# Patient Record
Sex: Female | Born: 1940 | Race: Black or African American | Hispanic: No | Smoking: Current some day smoker
Health system: Southern US, Community
[De-identification: ages and names within clinical notes are randomized; demographics above are authoritative.]

## PROBLEM LIST (undated history)

## (undated) DIAGNOSIS — H269 Unspecified cataract: Secondary | ICD-10-CM

## (undated) DIAGNOSIS — F1027 Alcohol dependence with alcohol-induced persisting dementia: Secondary | ICD-10-CM

## (undated) DIAGNOSIS — F0390 Unspecified dementia without behavioral disturbance: Secondary | ICD-10-CM

## (undated) DIAGNOSIS — Z86718 Personal history of other venous thrombosis and embolism: Secondary | ICD-10-CM

## (undated) DIAGNOSIS — R413 Other amnesia: Secondary | ICD-10-CM

## (undated) DIAGNOSIS — Z9981 Dependence on supplemental oxygen: Secondary | ICD-10-CM

## (undated) DIAGNOSIS — J449 Chronic obstructive pulmonary disease, unspecified: Secondary | ICD-10-CM

## (undated) DIAGNOSIS — M8580 Other specified disorders of bone density and structure, unspecified site: Secondary | ICD-10-CM

## (undated) DIAGNOSIS — F1011 Alcohol abuse, in remission: Secondary | ICD-10-CM

## (undated) DIAGNOSIS — M1990 Unspecified osteoarthritis, unspecified site: Secondary | ICD-10-CM

## (undated) DIAGNOSIS — K219 Gastro-esophageal reflux disease without esophagitis: Secondary | ICD-10-CM

## (undated) HISTORY — PX: HIP SURGERY: SHX245

## (undated) HISTORY — DX: Other amnesia: R41.3

## (undated) HISTORY — DX: Alcohol abuse, in remission: F10.11

## (undated) HISTORY — DX: Alcohol dependence with alcohol-induced persisting dementia: F10.27

## (undated) HISTORY — PX: FRACTURE SURGERY: SHX138

---

## 1999-06-19 ENCOUNTER — Encounter: Payer: Self-pay | Admitting: Emergency Medicine

## 1999-06-19 ENCOUNTER — Inpatient Hospital Stay (HOSPITAL_COMMUNITY): Admission: EM | Admit: 1999-06-19 | Discharge: 1999-06-22 | Payer: Self-pay | Admitting: Emergency Medicine

## 2001-02-27 ENCOUNTER — Ambulatory Visit (HOSPITAL_COMMUNITY): Admission: RE | Admit: 2001-02-27 | Discharge: 2001-02-27 | Payer: Self-pay | Admitting: Family Medicine

## 2001-02-27 ENCOUNTER — Encounter: Payer: Self-pay | Admitting: Family Medicine

## 2002-01-29 ENCOUNTER — Inpatient Hospital Stay (HOSPITAL_COMMUNITY): Admission: EM | Admit: 2002-01-29 | Discharge: 2002-02-06 | Payer: Self-pay | Admitting: Emergency Medicine

## 2002-01-29 ENCOUNTER — Encounter: Payer: Self-pay | Admitting: Emergency Medicine

## 2002-02-05 ENCOUNTER — Encounter: Payer: Self-pay | Admitting: Family Medicine

## 2004-07-12 ENCOUNTER — Ambulatory Visit (HOSPITAL_COMMUNITY): Admission: RE | Admit: 2004-07-12 | Discharge: 2004-07-12 | Payer: Self-pay | Admitting: Anesthesiology

## 2005-07-06 ENCOUNTER — Other Ambulatory Visit: Admission: RE | Admit: 2005-07-06 | Discharge: 2005-07-06 | Payer: Self-pay | Admitting: Obstetrics and Gynecology

## 2005-07-31 ENCOUNTER — Emergency Department (HOSPITAL_COMMUNITY): Admission: EM | Admit: 2005-07-31 | Discharge: 2005-08-01 | Payer: Self-pay | Admitting: Emergency Medicine

## 2006-07-14 ENCOUNTER — Inpatient Hospital Stay (HOSPITAL_COMMUNITY): Admission: EM | Admit: 2006-07-14 | Discharge: 2006-07-22 | Payer: Self-pay | Admitting: Emergency Medicine

## 2006-07-18 ENCOUNTER — Encounter (INDEPENDENT_AMBULATORY_CARE_PROVIDER_SITE_OTHER): Payer: Self-pay | Admitting: Specialist

## 2006-07-19 ENCOUNTER — Encounter (INDEPENDENT_AMBULATORY_CARE_PROVIDER_SITE_OTHER): Payer: Self-pay | Admitting: *Deleted

## 2006-09-08 ENCOUNTER — Inpatient Hospital Stay (HOSPITAL_COMMUNITY): Admission: EM | Admit: 2006-09-08 | Discharge: 2006-09-14 | Payer: Self-pay | Admitting: Emergency Medicine

## 2007-06-22 ENCOUNTER — Emergency Department (HOSPITAL_COMMUNITY): Admission: EM | Admit: 2007-06-22 | Discharge: 2007-06-22 | Payer: Self-pay | Admitting: Emergency Medicine

## 2007-08-18 ENCOUNTER — Inpatient Hospital Stay (HOSPITAL_COMMUNITY): Admission: EM | Admit: 2007-08-18 | Discharge: 2007-08-20 | Payer: Self-pay | Admitting: Emergency Medicine

## 2008-04-24 ENCOUNTER — Inpatient Hospital Stay (HOSPITAL_COMMUNITY): Admission: EM | Admit: 2008-04-24 | Discharge: 2008-05-04 | Payer: Self-pay | Admitting: Emergency Medicine

## 2008-04-30 ENCOUNTER — Ambulatory Visit: Payer: Self-pay | Admitting: Surgery

## 2008-04-30 ENCOUNTER — Encounter (INDEPENDENT_AMBULATORY_CARE_PROVIDER_SITE_OTHER): Payer: Self-pay | Admitting: Internal Medicine

## 2008-10-31 ENCOUNTER — Ambulatory Visit: Payer: Self-pay | Admitting: Internal Medicine

## 2008-10-31 ENCOUNTER — Inpatient Hospital Stay (HOSPITAL_COMMUNITY): Admission: EM | Admit: 2008-10-31 | Discharge: 2008-11-10 | Payer: Self-pay | Admitting: Emergency Medicine

## 2008-11-02 ENCOUNTER — Encounter: Payer: Self-pay | Admitting: Cardiology

## 2009-02-17 ENCOUNTER — Ambulatory Visit: Payer: Self-pay | Admitting: Cardiology

## 2009-02-18 ENCOUNTER — Inpatient Hospital Stay (HOSPITAL_COMMUNITY): Admission: EM | Admit: 2009-02-18 | Discharge: 2009-02-27 | Payer: Self-pay | Admitting: Emergency Medicine

## 2009-02-19 ENCOUNTER — Encounter (INDEPENDENT_AMBULATORY_CARE_PROVIDER_SITE_OTHER): Payer: Self-pay | Admitting: Internal Medicine

## 2009-02-23 ENCOUNTER — Encounter (INDEPENDENT_AMBULATORY_CARE_PROVIDER_SITE_OTHER): Payer: Self-pay | Admitting: Internal Medicine

## 2009-02-23 ENCOUNTER — Ambulatory Visit: Payer: Self-pay | Admitting: Vascular Surgery

## 2009-05-17 ENCOUNTER — Emergency Department (HOSPITAL_COMMUNITY): Admission: EM | Admit: 2009-05-17 | Discharge: 2009-05-17 | Payer: Self-pay | Admitting: Emergency Medicine

## 2009-09-30 ENCOUNTER — Inpatient Hospital Stay (HOSPITAL_COMMUNITY): Admission: EM | Admit: 2009-09-30 | Discharge: 2009-10-01 | Payer: Self-pay | Admitting: Emergency Medicine

## 2009-11-12 ENCOUNTER — Emergency Department (HOSPITAL_COMMUNITY): Admission: EM | Admit: 2009-11-12 | Discharge: 2009-11-12 | Payer: Self-pay | Admitting: Emergency Medicine

## 2009-12-01 ENCOUNTER — Encounter: Admission: RE | Admit: 2009-12-01 | Discharge: 2009-12-01 | Payer: Self-pay | Admitting: Internal Medicine

## 2010-01-25 ENCOUNTER — Emergency Department (HOSPITAL_COMMUNITY): Admission: EM | Admit: 2010-01-25 | Discharge: 2010-01-26 | Payer: Self-pay | Admitting: Emergency Medicine

## 2010-02-25 ENCOUNTER — Inpatient Hospital Stay (HOSPITAL_COMMUNITY): Admission: EM | Admit: 2010-02-25 | Discharge: 2010-02-27 | Payer: Self-pay | Admitting: Emergency Medicine

## 2010-04-30 ENCOUNTER — Inpatient Hospital Stay (HOSPITAL_COMMUNITY): Admission: EM | Admit: 2010-04-30 | Discharge: 2010-05-02 | Payer: Self-pay | Admitting: Emergency Medicine

## 2010-10-28 ENCOUNTER — Inpatient Hospital Stay (HOSPITAL_COMMUNITY)
Admission: EM | Admit: 2010-10-28 | Discharge: 2010-10-30 | Payer: Self-pay | Source: Home / Self Care | Attending: Internal Medicine | Admitting: Internal Medicine

## 2010-10-28 LAB — DIFFERENTIAL
Basophils Absolute: 0 10*3/uL (ref 0.0–0.1)
Basophils Relative: 0 % (ref 0–1)
Eosinophils Absolute: 0.2 10*3/uL (ref 0.0–0.7)
Eosinophils Relative: 3 % (ref 0–5)
Lymphocytes Relative: 15 % (ref 12–46)
Lymphs Abs: 1 10*3/uL (ref 0.7–4.0)
Monocytes Absolute: 1.1 10*3/uL — ABNORMAL HIGH (ref 0.1–1.0)
Monocytes Relative: 15 % — ABNORMAL HIGH (ref 3–12)
Neutro Abs: 4.7 10*3/uL (ref 1.7–7.7)
Neutrophils Relative %: 67 % (ref 43–77)

## 2010-10-28 LAB — BASIC METABOLIC PANEL
BUN: 4 mg/dL — ABNORMAL LOW (ref 6–23)
CO2: 32 mEq/L (ref 19–32)
Calcium: 9.4 mg/dL (ref 8.4–10.5)
Chloride: 102 mEq/L (ref 96–112)
Creatinine, Ser: 0.51 mg/dL (ref 0.4–1.2)
GFR calc Af Amer: >60 mL/min (ref >60)
GFR calc non Af Amer: >60 mL/min (ref >60)
Glucose, Bld: 103 mg/dL — ABNORMAL HIGH (ref 70–99)
Potassium: 3.2 mEq/L — ABNORMAL LOW (ref 3.5–5.1)
Sodium: 140 mEq/L (ref 135–145)

## 2010-10-28 LAB — CBC
HCT: 36.6 % (ref 36.0–46.0)
Hemoglobin: 11.5 g/dL — ABNORMAL LOW (ref 12.0–15.0)
MCH: 24.2 pg — ABNORMAL LOW (ref 26.0–34.0)
MCHC: 31.4 g/dL (ref 30.0–36.0)
MCV: 76.9 fL — ABNORMAL LOW (ref 78.0–100.0)
Platelets: 221 10*3/uL (ref 150–400)
RBC: 4.76 MIL/uL (ref 3.87–5.11)
RDW: 14.4 % (ref 11.5–15.5)
WBC: 7.1 10*3/uL (ref 4.0–10.5)

## 2010-10-28 LAB — BLOOD GAS, ARTERIAL
Acid-Base Excess: 2.9 mmol/L — ABNORMAL HIGH (ref 0.0–2.0)
Bicarbonate: 28.9 mEq/L — ABNORMAL HIGH (ref 20.0–24.0)
O2 Content: 2 L/min
O2 Saturation: 93.1 %
Patient temperature: 98.6
TCO2: 26.3 mmol/L (ref 0–100)
pCO2 arterial: 52.8 mmHg — ABNORMAL HIGH (ref 35.0–45.0)
pH, Arterial: 7.356 (ref 7.350–7.400)
pO2, Arterial: 70 mmHg — ABNORMAL LOW (ref 80.0–100.0)

## 2010-10-28 LAB — URINALYSIS, ROUTINE W REFLEX MICROSCOPIC
Bilirubin Urine: NEGATIVE
Hemoglobin, Urine: NEGATIVE
Ketones, ur: NEGATIVE mg/dL
Nitrite: NEGATIVE
Protein, ur: NEGATIVE mg/dL
Specific Gravity, Urine: 1.014 (ref 1.005–1.030)
Urine Glucose, Fasting: NEGATIVE mg/dL
Urobilinogen, UA: 0.2 mg/dL (ref 0.0–1.0)
pH: 5.5 (ref 5.0–8.0)

## 2010-10-28 LAB — URINE MICROSCOPIC-ADD ON

## 2010-10-28 LAB — BRAIN NATRIURETIC PEPTIDE: Pro B Natriuretic peptide (BNP): <30 pg/mL (ref 0.0–100.0)

## 2010-10-28 LAB — POCT CARDIAC MARKERS
CKMB, poc: 1.8 ng/mL (ref 1.0–8.0)
Myoglobin, poc: 46.8 ng/mL (ref 12–200)
Troponin i, poc: <0.05 ng/mL (ref 0.00–0.09)

## 2010-11-07 LAB — CBC
HCT: 35.4 % — ABNORMAL LOW (ref 36.0–46.0)
Hemoglobin: 11 g/dL — ABNORMAL LOW (ref 12.0–15.0)
MCH: 24.2 pg — ABNORMAL LOW (ref 26.0–34.0)
MCHC: 31.1 g/dL (ref 30.0–36.0)
MCV: 77.8 fL — ABNORMAL LOW (ref 78.0–100.0)
Platelets: 240 10*3/uL (ref 150–400)
RBC: 4.55 MIL/uL (ref 3.87–5.11)
RDW: 14.6 % (ref 11.5–15.5)
WBC: 8.1 10*3/uL (ref 4.0–10.5)

## 2010-11-07 LAB — CARDIAC PANEL(CRET KIN+CKTOT+MB+TROPI)
CK, MB: 2.4 ng/mL (ref 0.3–4.0)
CK, MB: 2 ng/mL (ref 0.3–4.0)
CK, MB: 2.3 ng/mL (ref 0.3–4.0)
Relative Index: INVALID (ref 0.0–2.5)
Relative Index: INVALID (ref 0.0–2.5)
Relative Index: INVALID (ref 0.0–2.5)
Total CK: 57 U/L (ref 7–177)
Total CK: 40 U/L (ref 7–177)
Total CK: 45 U/L (ref 7–177)
Troponin I: 0.05 ng/mL (ref 0.00–0.06)
Troponin I: 0.04 ng/mL (ref 0.00–0.06)
Troponin I: 0.05 ng/mL (ref 0.00–0.06)

## 2010-11-07 LAB — LIPID PANEL
Cholesterol: 140 mg/dL (ref 0–200)
HDL: 58 mg/dL (ref >39)
LDL Cholesterol: 75 mg/dL (ref 0–99)
Total CHOL/HDL Ratio: 2.4 RATIO
Triglycerides: 33 mg/dL (ref <150)
VLDL: 7 mg/dL (ref 0–40)

## 2010-11-07 LAB — BASIC METABOLIC PANEL
BUN: 9 mg/dL (ref 6–23)
CO2: 31 mEq/L (ref 19–32)
Calcium: 9.5 mg/dL (ref 8.4–10.5)
Chloride: 105 mEq/L (ref 96–112)
Creatinine, Ser: 0.64 mg/dL (ref 0.4–1.2)
GFR calc Af Amer: >60 mL/min (ref >60)
GFR calc non Af Amer: >60 mL/min (ref >60)
Glucose, Bld: 183 mg/dL — ABNORMAL HIGH (ref 70–99)
Potassium: 4.7 mEq/L (ref 3.5–5.1)
Sodium: 141 mEq/L (ref 135–145)

## 2010-11-07 LAB — TSH: TSH: 0.103 u[IU]/mL — ABNORMAL LOW (ref 0.350–4.500)

## 2010-11-07 LAB — MAGNESIUM: Magnesium: 2.2 mg/dL (ref 1.5–2.5)

## 2010-11-11 NOTE — H&P (Signed)
[name], [name]              ACCOUNT NO.:  [id number]  MEDICAL RECORD NO.:  [id number]          PATIENT TYPE:  INP  LOCATION:  0109                         FACILITY:  [facility]  PHYSICIAN:  Hartley Barefoot, MD    DATE OF BIRTH:  1941/07/22  DATE OF ADMISSION:  10/28/2010 DATE OF DISCHARGE:                             HISTORY & PHYSICAL   PRIMARY CARE PROVIDER:  Fleet Contras, M.D.  The patient is being admitted to [facility], [employer] [facility].  CHIEF COMPLAINT:  Shortness of breath.  HISTORY OF PRESENT ILLNESS:  [name] is a very pleasant 70 year old female with a history of COPD, asthma, bronchitis, chronic respiratory failure on home O2, who presents to the [facility] ED with a chief complaint of shortness of breath.  Information is obtained by the patient and her granddaughter, with whom she lives and who is at the bedside.  The patient states that she developed a cough about 10 daysago.  She used her inhaler and her home treatments and increased her oxygen, but continued to worsen.  She states for the last 3 days, she developed a cough, producing thick yellowish-green sputum.  She has also developed some chest soreness in the left rib area when coughing.  She states that for the last two nights, she has been unable to lie flat and has been sleeping in an upright position.  Ordinarily, she states that she is able to lie flat when sleeping.  She denies any fever, chills, nausea, vomiting, diarrhea.  Her granddaughter indicates that her shortness of breath worsened over the last 2 days with exertion.  She becomes more short of breath with very few steps when ambulating.  The patient is supposed to be on continuous home O2, but is noncompliant with that.  The patient does continue to smoke approximately seven cigarettes a day.  This morning, she states that she awakened and her shortness of breath had worsened, so she came to the emergency room. Symptoms came  on gradually, have persisted and worsened, are characterized as moderate to severe.  We are asked to admit for further evaluation and treatment.  ALLERGIES:  No known drug allergies.  PAST MEDICAL HISTORY: 1. Severe emphysema. 2. Chronic respiratory failure, on home oxygen. 3. Ongoing tobacco use. 4. DVT. 5. History of EtOH abuse.  PAST SURGICAL HISTORY:  History of bilateral tubal ligation.  FAMILY MEDICAL HISTORY:  Positive for coronary artery disease in her father who died of an MI, and her mother and sister had rheumatoid fever.  No history of hypertension or cancer in the family.  SOCIAL HISTORY:  The patient lives with her granddaughter.  She smokes seven cigarettes a day.  She has done so for the last couple of years. Prior to that, she smoked one to two packs a day and had done so since she was 17.  She is formally a heavy drinker, but now states that she only has one beer on occasion.  She denies any illicit drug usage.  MEDICATIONS: 1. Megace 625 mg/mL 1 teaspoon every morning. 2. Tobramycin sulfate/dexamethasone right eye 1 drop b.i.d. 3. Ibuprofen 400 mg  p.o. 1 tab every 8 hours as needed for pain. 4. Advair Diskus 250/50 one puff b.i.d. as needed. 5. Multivitamin p.o. daily. 6. ProAir inhaler 2 puffs every 6 hours as needed for shortness of     breath. 7. Atrovent inhaler 2 puffs 4 times daily as needed for shortness of     breath.  REVIEW OF SYSTEMS:  GENERAL:  Negative for fever, chills, anorexia, unintentional weight loss.  ENT:  Negative for ear pain, nasal congestion, sore throat.  CV:  Negative for chest pain, palpitation, lower extremity edema.  RESPIRATORY:  See HPI.  MUSCULOSKELETAL: Negative for joint pain/erythema or muscle weakness.  NEURO:  Negative for headache, visual disturbances, numbness, tingling of extremities. GI:  Negative for abdominal pain, nausea, vomiting, constipation, diarrhea, melena.  GU:  Negative for dysuria, hematuria,  frequency, or urgency.  PSYCH:  Negative for any anxiety or depression.  HEME: Negative for any unusual bruising or bleeding.  LABS:  WBC is 7.1, hemoglobin 11.5, hematocrit 36.6, MCV 76.9, platelets 221.  Sodium 140, potassium 3.2, chloride 102, CO2 32, BUN 4, creatinine 0.51, glucose 103.  CK-MB 1.8, troponin I less than 0.05, myoglobin 46.8.  Arterial blood gas yields a pH of 7.35, CO2 of 52.8, O2 of 70.0, bicarb of 28.9, total CO2 26.3.  X-RAY:  Chest x-ray shows severe COPD, no acute abnormalities.  EKG reviewed by Dr. Sunnie Nielsen yields sinus tachycardia.  PHYSICAL EXAM:  VITAL SIGNS:  Temperature 97.7, BP 106/68, heart rate 110, respirations 24, sats 100% on 7 L nasal cannula. GENERAL:  Awake, sitting up in bed, somewhat cachectic appearing.  No acute distress. HEAD:  Normocephalic, atraumatic.  Pupils equal, round, reactive to light.  EOMI.  Mucous membranes of her mouth are moist and pink.  No obvious lesion or exudate in her nose or ears. CV:  Tachycardic, but regular.  No murmur, gallop, or rub.  No lower extremity edema. RESPIRATORY:  Mild increased work of breathing.  No rhonchi, wheezes, or rales.  Breath sounds are very diminished, particularly in the bases. ABDOMEN:  Flat, soft, positive bowel sounds throughout, nontender to palpation. NEURO:  Alert and oriented x3.  Speech clear.  Facial symmetry. MUSCULOSKELETAL:  Moves all extremities.  No joint swelling/erythema. Full range of motion.  Extremities without clubbing or cyanosis.  ASSESSMENT/PLAN: 1. Dyspnea secondary to chronic obstructive pulmonary disease     exacerbation.  Will admit to Tele.  Will provide oxygen support,     DuoNebs, Solu-Medrol, and Avelox empirically.  Will also check a     BNP as the patient states she is unable to lie down to sleep.     There is no history of congestive heart failure and she does not     appear to be overloaded.  Will monitor sats closely. 2. Chronic respiratory failure.   The patient is noncompliant with home     oxygen.  See #1. 3. Hypokalemia.  Potassium was 3.2.  Will replete and recheck in the     a.m.  Will check magnesium as well. 4. Anemia.  Will provide ferrous sulfate. out patient colonoscopy. 5. Ongoing tobacco use.  Will provide tobacco cessation counseling. 6. Deep vein thrombosis prophylaxis.  Will use Lovenox. 7. Code status:  The patient is a full code.  This assessment and plan was discussed with Dr. Sunnie Nielsen.     Gwenyth Bender, NP   ______________________________ Hartley Barefoot, MD    KMB/MEDQ  D:  10/28/2010  T:  10/28/2010  Job:  [id number]  cc:   Fleet Contras, M.D. Fax: [phone]  Electronically Signed by Hartley Barefoot MD on 10/28/2010 10:09:31 PM Electronically Signed by Toya Smothers  on 11/11/2010 08:25:52 AM

## 2010-12-02 NOTE — Discharge Summary (Signed)
  [name], [name]              ACCOUNT NO.:  [id number]  MEDICAL RECORD NO.:  [id number]          PATIENT TYPE:  INP  LOCATION:  1442                         FACILITY:  [facility]  PHYSICIAN:  Kable Haywood I Onita Pfluger, MD      DATE OF BIRTH:  1940/12/02  DATE OF ADMISSION:  10/28/2010 DATE OF DISCHARGE:  10/30/2010                              DISCHARGE SUMMARY   DISCHARGE DIAGNOSES: 1. Chronic obstructive pulmonary disease with exacerbation. 2. Chronic respiratory failure, noncompliance with home oxygen. 3. Hypokalemia, status post replacement. 4. Iron deficiency anemia.  The patient admitted she already had     colonoscopy.  The patient was advised to follow up this with her     primary care physician. 5. Ongoing tobacco abuse. 6. Steroid-induced hyperglycemia.  DISCHARGE MEDICATIONS: 1. Avelox 400 mg p.o. daily for 7 days. 2. Prednisone taper dose. 3. Advair Diskus 250/50 one puff twice daily. 4. Atrovent 2 puffs q.4 h. P.r.n. 5. Megestrol 1 teaspoon every morning. 6. Multivitamin 1 tablet daily. 7. ProAir 2 puff inhaler q.6 h. as needed.  FOLLOWUP:  The patient was advised to follow up with her physician within 1 week.  PROCEDURE:  Chest x-ray, severe COPD, no acute abnormality.  HISTORY OF PRESENT ILLNESS:  This is a 70 year old female with a history of COPD; bronchitis; chronic respiratory failure, on home oxygen, noncompliance with her oxygen; ongoing tobacco abuse, who presented with shortness of breath, cough productive of thick yellow greenish sputum. She also developed some chest soreness in the left rib area when coughing.  She has been unable to lie flat and has been sitting in an upright position.  On admission, the patient was found to be with vital signs of temperature 97.7, blood pressure 106/68, heart rate 100.3.  The patient admitted with COPD exacerbation and the patient was started on IV antibiotics and steroid with good response.  Cardiac enzymes were cycled,  which were negative.  The patient had some mild tachycardia, which felt could be secondary to medications, mainly the nebulizer treatment.  The patient was advised to follow up with her primary care physician and the patient discharged in a stable condition.     Leobardo Granlund Bosie Helper, MD     HIE/MEDQ  D:  11/17/2010  T:  11/18/2010  Job:  [id number]  cc:   Fleet Contras, M.D. Fax: [phone]  Electronically Signed by Ebony Cargo MD on 12/02/2010 06:53:19 PM

## 2011-01-08 LAB — URINALYSIS, ROUTINE W REFLEX MICROSCOPIC
Bilirubin Urine: NEGATIVE
Ketones, ur: NEGATIVE mg/dL
Nitrite: NEGATIVE
Protein, ur: NEGATIVE mg/dL
pH: 6 (ref 5.0–8.0)

## 2011-01-08 LAB — CBC
HCT: 34.7 % — ABNORMAL LOW (ref 36.0–46.0)
HCT: 37 % (ref 36.0–46.0)
MCH: 25 pg — ABNORMAL LOW (ref 26.0–34.0)
MCH: 25.6 pg — ABNORMAL LOW (ref 26.0–34.0)
MCHC: 31.8 g/dL (ref 30.0–36.0)
MCHC: 33.1 g/dL (ref 30.0–36.0)
MCV: 77.3 fL — ABNORMAL LOW (ref 78.0–100.0)
MCV: 77.6 fL — ABNORMAL LOW (ref 78.0–100.0)
MCV: 78.7 fL (ref 78.0–100.0)
Platelets: 174 10*3/uL (ref 150–400)
Platelets: 198 10*3/uL (ref 150–400)
RBC: 4.48 MIL/uL (ref 3.87–5.11)
RDW: 13.9 % (ref 11.5–15.5)
RDW: 15.5 % (ref 11.5–15.5)
WBC: 2.2 10*3/uL — ABNORMAL LOW (ref 4.0–10.5)

## 2011-01-08 LAB — BASIC METABOLIC PANEL
BUN: 8 mg/dL (ref 6–23)
CO2: 29 mEq/L (ref 19–32)
Calcium: 9.1 mg/dL (ref 8.4–10.5)
Chloride: 107 mEq/L (ref 96–112)
Creatinine, Ser: 0.62 mg/dL (ref 0.4–1.2)
GFR calc Af Amer: >60 mL/min (ref >60)
Glucose, Bld: 150 mg/dL — ABNORMAL HIGH (ref 70–99)

## 2011-01-08 LAB — COMPREHENSIVE METABOLIC PANEL
ALT: 13 U/L (ref 0–35)
Albumin: 3.2 g/dL — ABNORMAL LOW (ref 3.5–5.2)
Alkaline Phosphatase: 62 U/L (ref 39–117)
BUN: 6 mg/dL (ref 6–23)
Chloride: 100 mEq/L (ref 96–112)
Potassium: 4.1 mEq/L (ref 3.5–5.1)
Sodium: 138 mEq/L (ref 135–145)
Total Bilirubin: 0.7 mg/dL (ref 0.3–1.2)
Total Protein: 5.8 g/dL — ABNORMAL LOW (ref 6.0–8.3)

## 2011-01-08 LAB — POCT I-STAT, CHEM 8
BUN: <3 mg/dL — ABNORMAL LOW (ref 6–23)
Calcium, Ion: 1.15 mmol/L (ref 1.12–1.32)
Chloride: 93 mEq/L — ABNORMAL LOW (ref 96–112)
Chloride: 107 mEq/L (ref 96–112)
Creatinine, Ser: 0.8 mg/dL (ref 0.4–1.2)
Glucose, Bld: 88 mg/dL (ref 70–99)
Glucose, Bld: 88 mg/dL (ref 70–99)
HCT: 38 % (ref 36.0–46.0)
Hemoglobin: 12.9 g/dL (ref 12.0–15.0)
Potassium: 3.1 mEq/L — ABNORMAL LOW (ref 3.5–5.1)
Potassium: 3.8 mEq/L (ref 3.5–5.1)
Sodium: 138 mEq/L (ref 135–145)

## 2011-01-08 LAB — DIFFERENTIAL
Basophils Absolute: 0 10*3/uL (ref 0.0–0.1)
Basophils Relative: 1 % (ref 0–1)
Eosinophils Absolute: 0.1 10*3/uL (ref 0.0–0.7)
Eosinophils Relative: 3 % (ref 0–5)
Monocytes Absolute: 0.5 10*3/uL (ref 0.1–1.0)

## 2011-01-08 LAB — BLOOD GAS, ARTERIAL
Acid-Base Excess: 5.2 mmol/L — ABNORMAL HIGH (ref 0.0–2.0)
Drawn by: 235321
TCO2: 28 mmol/L (ref 0–100)
pCO2 arterial: 51.8 mmHg — ABNORMAL HIGH (ref 35.0–45.0)
pH, Arterial: 7.39 (ref 7.350–7.400)
pO2, Arterial: 94.3 mmHg (ref 80.0–100.0)

## 2011-01-08 LAB — URINE MICROSCOPIC-ADD ON

## 2011-01-10 LAB — CBC
HCT: 33.5 % — ABNORMAL LOW (ref 36.0–46.0)
HCT: 34.5 % — ABNORMAL LOW (ref 36.0–46.0)
HCT: 38 % (ref 36.0–46.0)
Hemoglobin: 10.6 g/dL — ABNORMAL LOW (ref 12.0–15.0)
Hemoglobin: 12.4 g/dL (ref 12.0–15.0)
MCHC: 32.6 g/dL (ref 30.0–36.0)
MCV: 76.3 fL — ABNORMAL LOW (ref 78.0–100.0)
MCV: 78.3 fL (ref 78.0–100.0)
Platelets: 189 10*3/uL (ref 150–400)
RBC: 4.97 MIL/uL (ref 3.87–5.11)
RDW: 15.5 % (ref 11.5–15.5)
WBC: 2.8 10*3/uL — ABNORMAL LOW (ref 4.0–10.5)

## 2011-01-10 LAB — CARDIAC PANEL(CRET KIN+CKTOT+MB+TROPI)
CK, MB: 2.6 ng/mL (ref 0.3–4.0)
Relative Index: INVALID (ref 0.0–2.5)
Troponin I: 0.01 ng/mL (ref 0.00–0.06)

## 2011-01-10 LAB — LIPID PANEL
Cholesterol: 174 mg/dL (ref 0–200)
HDL: 104 mg/dL (ref >39)
Total CHOL/HDL Ratio: 1.7 RATIO
VLDL: 10 mg/dL (ref 0–40)

## 2011-01-10 LAB — COMPREHENSIVE METABOLIC PANEL
Alkaline Phosphatase: 57 U/L (ref 39–117)
BUN: 8 mg/dL (ref 6–23)
Chloride: 104 mEq/L (ref 96–112)
Creatinine, Ser: 0.66 mg/dL (ref 0.4–1.2)
Glucose, Bld: 237 mg/dL — ABNORMAL HIGH (ref 70–99)
Potassium: 3.7 mEq/L (ref 3.5–5.1)
Total Bilirubin: 0.4 mg/dL (ref 0.3–1.2)
Total Protein: 5.4 g/dL — ABNORMAL LOW (ref 6.0–8.3)

## 2011-01-10 LAB — DIFFERENTIAL
Basophils Absolute: 0 10*3/uL (ref 0.0–0.1)
Basophils Relative: 0 % (ref 0–1)
Eosinophils Relative: 3 % (ref 0–5)
Lymphocytes Relative: 45 % (ref 12–46)
Lymphocytes Relative: 9 % — ABNORMAL LOW (ref 12–46)
Monocytes Absolute: 0.6 10*3/uL (ref 0.1–1.0)
Monocytes Relative: 10 % (ref 3–12)
Neutro Abs: 2.4 10*3/uL (ref 1.7–7.7)
Neutro Abs: 7.6 10*3/uL (ref 1.7–7.7)
Neutrophils Relative %: 86 % — ABNORMAL HIGH (ref 43–77)

## 2011-01-10 LAB — T4: T4, Total: 7 ug/dL (ref 5.0–12.5)

## 2011-01-10 LAB — CK TOTAL AND CKMB (NOT AT ARMC)
Relative Index: INVALID (ref 0.0–2.5)
Total CK: 84 U/L (ref 7–177)

## 2011-01-10 LAB — POCT I-STAT 3, ART BLOOD GAS (G3+)
Acid-Base Excess: 7 mmol/L — ABNORMAL HIGH (ref 0.0–2.0)
Bicarbonate: 35 mEq/L — ABNORMAL HIGH (ref 20.0–24.0)
O2 Saturation: 100 %
O2 Saturation: 97 %
TCO2: 37 mmol/L (ref 0–100)
pCO2 arterial: 48.3 mmHg — ABNORMAL HIGH (ref 35.0–45.0)
pO2, Arterial: 241 mmHg — ABNORMAL HIGH (ref 80.0–100.0)

## 2011-01-10 LAB — POCT CARDIAC MARKERS
CKMB, poc: 1.3 ng/mL (ref 1.0–8.0)
Myoglobin, poc: 48.7 ng/mL (ref 12–200)
Troponin i, poc: <0.05 ng/mL (ref 0.00–0.09)

## 2011-01-10 LAB — BASIC METABOLIC PANEL
BUN: 7 mg/dL (ref 6–23)
CO2: 31 mEq/L (ref 19–32)
Calcium: 9.1 mg/dL (ref 8.4–10.5)
Calcium: 9.4 mg/dL (ref 8.4–10.5)
GFR calc Af Amer: >60 mL/min (ref >60)
GFR calc non Af Amer: >60 mL/min (ref >60)
GFR calc non Af Amer: >60 mL/min (ref >60)
Glucose, Bld: 144 mg/dL — ABNORMAL HIGH (ref 70–99)
Potassium: 3.6 mEq/L (ref 3.5–5.1)
Sodium: 137 mEq/L (ref 135–145)
Sodium: 140 mEq/L (ref 135–145)

## 2011-01-10 LAB — T3: T3, Total: 40.8 ng/dl — ABNORMAL LOW (ref 80.0–204.0)

## 2011-01-11 LAB — CBC
MCV: 77.4 fL — ABNORMAL LOW (ref 78.0–100.0)
Platelets: 200 10*3/uL (ref 150–400)
WBC: 5.1 10*3/uL (ref 4.0–10.5)

## 2011-01-11 LAB — DIFFERENTIAL
Eosinophils Absolute: 0.1 10*3/uL (ref 0.0–0.7)
Lymphocytes Relative: 49 % — ABNORMAL HIGH (ref 12–46)
Lymphs Abs: 2.5 10*3/uL (ref 0.7–4.0)
Neutro Abs: 2 10*3/uL (ref 1.7–7.7)
Neutrophils Relative %: 38 % — ABNORMAL LOW (ref 43–77)

## 2011-01-11 LAB — BASIC METABOLIC PANEL
BUN: 9 mg/dL (ref 6–23)
Calcium: 9.2 mg/dL (ref 8.4–10.5)
Creatinine, Ser: 0.7 mg/dL (ref 0.4–1.2)
GFR calc non Af Amer: >60 mL/min (ref >60)

## 2011-01-24 LAB — CARDIAC PANEL(CRET KIN+CKTOT+MB+TROPI)
CK, MB: 1.9 ng/mL (ref 0.3–4.0)
CK, MB: 2 ng/mL (ref 0.3–4.0)
CK, MB: 2.1 ng/mL (ref 0.3–4.0)
Relative Index: INVALID (ref 0.0–2.5)
Relative Index: INVALID (ref 0.0–2.5)
Total CK: 42 U/L (ref 7–177)
Total CK: 44 U/L (ref 7–177)
Total CK: 49 U/L (ref 7–177)
Troponin I: 0.01 ng/mL (ref 0.00–0.06)

## 2011-01-24 LAB — URINALYSIS, ROUTINE W REFLEX MICROSCOPIC
Bilirubin Urine: NEGATIVE
Glucose, UA: 250 mg/dL — AB
Hgb urine dipstick: NEGATIVE
Nitrite: NEGATIVE
Protein, ur: NEGATIVE mg/dL
Specific Gravity, Urine: 1.022 (ref 1.005–1.030)
Urobilinogen, UA: 0.2 mg/dL (ref 0.0–1.0)
pH: 5.5 (ref 5.0–8.0)

## 2011-01-24 LAB — DIFFERENTIAL
Basophils Absolute: 0 10*3/uL (ref 0.0–0.1)
Basophils Relative: 0 % (ref 0–1)
Eosinophils Absolute: 0.1 10*3/uL (ref 0.0–0.7)
Eosinophils Relative: 1 % (ref 0–5)
Lymphocytes Relative: 31 % (ref 12–46)

## 2011-01-24 LAB — BLOOD GAS, ARTERIAL
Acid-Base Excess: 3.5 mmol/L — ABNORMAL HIGH (ref 0.0–2.0)
Drawn by: 103701
O2 Content: 2 L/min
O2 Saturation: 97.4 %
Patient temperature: 98.6

## 2011-01-24 LAB — COMPREHENSIVE METABOLIC PANEL
ALT: 16 U/L (ref 0–35)
AST: 23 U/L (ref 0–37)
CO2: 31 mEq/L (ref 19–32)
Chloride: 97 mEq/L (ref 96–112)
Creatinine, Ser: 0.61 mg/dL (ref 0.4–1.2)
GFR calc Af Amer: >60 mL/min (ref >60)
GFR calc non Af Amer: >60 mL/min (ref >60)
Total Bilirubin: 0.9 mg/dL (ref 0.3–1.2)

## 2011-01-24 LAB — POCT CARDIAC MARKERS
CKMB, poc: 2.1 ng/mL (ref 1.0–8.0)
Myoglobin, poc: 51.1 ng/mL (ref 12–200)
Troponin i, poc: <0.05 ng/mL (ref 0.00–0.09)

## 2011-01-24 LAB — URINE MICROSCOPIC-ADD ON

## 2011-01-24 LAB — CBC
MCV: 78 fL (ref 78.0–100.0)
RBC: 5.33 MIL/uL — ABNORMAL HIGH (ref 3.87–5.11)
WBC: 3.8 10*3/uL — ABNORMAL LOW (ref 4.0–10.5)

## 2011-01-29 LAB — DIFFERENTIAL
Eosinophils Absolute: 0 10*3/uL (ref 0.0–0.7)
Eosinophils Relative: 0 % (ref 0–5)
Lymphocytes Relative: 52 % — ABNORMAL HIGH (ref 12–46)
Lymphs Abs: 1.6 10*3/uL (ref 0.7–4.0)
Monocytes Absolute: 0.3 10*3/uL (ref 0.1–1.0)
Monocytes Relative: 8 % (ref 3–12)

## 2011-01-29 LAB — CBC
HCT: 40.9 % (ref 36.0–46.0)
Hemoglobin: 13.4 g/dL (ref 12.0–15.0)
MCV: 75 fL — ABNORMAL LOW (ref 78.0–100.0)
Platelets: 151 10*3/uL (ref 150–400)
RBC: 5.45 MIL/uL — ABNORMAL HIGH (ref 3.87–5.11)
WBC: 3.1 10*3/uL — ABNORMAL LOW (ref 4.0–10.5)

## 2011-01-29 LAB — BASIC METABOLIC PANEL
BUN: 4 mg/dL — ABNORMAL LOW (ref 6–23)
Chloride: 99 mEq/L (ref 96–112)
GFR calc non Af Amer: >60 mL/min (ref >60)
Potassium: 3.4 mEq/L — ABNORMAL LOW (ref 3.5–5.1)
Sodium: 134 mEq/L — ABNORMAL LOW (ref 135–145)

## 2011-01-29 LAB — URINE CULTURE: Colony Count: 40000

## 2011-01-29 LAB — URINALYSIS, ROUTINE W REFLEX MICROSCOPIC
Bilirubin Urine: NEGATIVE
Glucose, UA: NEGATIVE mg/dL
Ketones, ur: NEGATIVE mg/dL
Protein, ur: NEGATIVE mg/dL
pH: 5.5 (ref 5.0–8.0)

## 2011-01-31 LAB — GLUCOSE, CAPILLARY
Glucose-Capillary: 104 mg/dL — ABNORMAL HIGH (ref 70–99)
Glucose-Capillary: 109 mg/dL — ABNORMAL HIGH (ref 70–99)
Glucose-Capillary: 117 mg/dL — ABNORMAL HIGH (ref 70–99)
Glucose-Capillary: 119 mg/dL — ABNORMAL HIGH (ref 70–99)
Glucose-Capillary: 124 mg/dL — ABNORMAL HIGH (ref 70–99)
Glucose-Capillary: 153 mg/dL — ABNORMAL HIGH (ref 70–99)
Glucose-Capillary: 165 mg/dL — ABNORMAL HIGH (ref 70–99)
Glucose-Capillary: 178 mg/dL — ABNORMAL HIGH (ref 70–99)
Glucose-Capillary: 179 mg/dL — ABNORMAL HIGH (ref 70–99)
Glucose-Capillary: 214 mg/dL — ABNORMAL HIGH (ref 70–99)
Glucose-Capillary: 88 mg/dL (ref 70–99)

## 2011-01-31 LAB — CARDIAC PANEL(CRET KIN+CKTOT+MB+TROPI)
CK, MB: 1 ng/mL (ref 0.3–4.0)
CK, MB: 1.3 ng/mL (ref 0.3–4.0)
Relative Index: INVALID (ref 0.0–2.5)
Relative Index: INVALID (ref 0.0–2.5)
Relative Index: INVALID (ref 0.0–2.5)
Relative Index: INVALID (ref 0.0–2.5)
Total CK: 17 U/L (ref 7–177)
Total CK: 22 U/L (ref 7–177)
Troponin I: 0.04 ng/mL (ref 0.00–0.06)

## 2011-01-31 LAB — BASIC METABOLIC PANEL
BUN: 10 mg/dL (ref 6–23)
BUN: 11 mg/dL (ref 6–23)
BUN: 15 mg/dL (ref 6–23)
CO2: 32 mEq/L (ref 19–32)
CO2: 35 mEq/L — ABNORMAL HIGH (ref 19–32)
Calcium: 9.1 mg/dL (ref 8.4–10.5)
Chloride: 103 mEq/L (ref 96–112)
Chloride: 104 mEq/L (ref 96–112)
Creatinine, Ser: 0.71 mg/dL (ref 0.4–1.2)
GFR calc Af Amer: >60 mL/min (ref >60)
GFR calc non Af Amer: >60 mL/min (ref >60)
GFR calc non Af Amer: >60 mL/min (ref >60)
Glucose, Bld: 119 mg/dL — ABNORMAL HIGH (ref 70–99)
Potassium: 3.5 mEq/L (ref 3.5–5.1)
Potassium: 4.3 mEq/L (ref 3.5–5.1)
Potassium: 4.5 mEq/L (ref 3.5–5.1)
Sodium: 141 mEq/L (ref 135–145)
Sodium: 142 mEq/L (ref 135–145)

## 2011-01-31 LAB — CBC
HCT: 31.4 % — ABNORMAL LOW (ref 36.0–46.0)
HCT: 32.3 % — ABNORMAL LOW (ref 36.0–46.0)
HCT: 33.9 % — ABNORMAL LOW (ref 36.0–46.0)
HCT: 35.3 % — ABNORMAL LOW (ref 36.0–46.0)
Hemoglobin: 10.1 g/dL — ABNORMAL LOW (ref 12.0–15.0)
Hemoglobin: 10.7 g/dL — ABNORMAL LOW (ref 12.0–15.0)
Hemoglobin: 11.6 g/dL — ABNORMAL LOW (ref 12.0–15.0)
MCHC: 32.3 g/dL (ref 30.0–36.0)
MCHC: 32.3 g/dL (ref 30.0–36.0)
MCHC: 32.7 g/dL (ref 30.0–36.0)
MCV: 77.1 fL — ABNORMAL LOW (ref 78.0–100.0)
MCV: 77.6 fL — ABNORMAL LOW (ref 78.0–100.0)
MCV: 78.4 fL (ref 78.0–100.0)
Platelets: 171 10*3/uL (ref 150–400)
Platelets: 191 10*3/uL (ref 150–400)
RBC: 4.05 MIL/uL (ref 3.87–5.11)
RBC: 4.23 MIL/uL (ref 3.87–5.11)
RDW: 15.4 % (ref 11.5–15.5)
RDW: 15.7 % — ABNORMAL HIGH (ref 11.5–15.5)
WBC: 5.5 10*3/uL (ref 4.0–10.5)
WBC: 6.3 10*3/uL (ref 4.0–10.5)
WBC: 9.6 10*3/uL (ref 4.0–10.5)

## 2011-01-31 LAB — PROTIME-INR
INR: 1 (ref 0.00–1.49)
INR: 1.1 (ref 0.00–1.49)
Prothrombin Time: 12.9 seconds (ref 11.6–15.2)
Prothrombin Time: 15.8 seconds — ABNORMAL HIGH (ref 11.6–15.2)

## 2011-01-31 LAB — BRAIN NATRIURETIC PEPTIDE: Pro B Natriuretic peptide (BNP): 46.9 pg/mL (ref 0.0–100.0)

## 2011-02-01 LAB — CARDIAC PANEL(CRET KIN+CKTOT+MB+TROPI)
CK, MB: 1.5 ng/mL (ref 0.3–4.0)
CK, MB: 1.8 ng/mL (ref 0.3–4.0)
Relative Index: INVALID (ref 0.0–2.5)
Total CK: 36 U/L (ref 7–177)
Total CK: 37 U/L (ref 7–177)
Troponin I: 0.02 ng/mL (ref 0.00–0.06)
Troponin I: <0.01 ng/mL (ref 0.00–0.06)

## 2011-02-01 LAB — IRON AND TIBC: Saturation Ratios: 17 % — ABNORMAL LOW (ref 20–55)

## 2011-02-01 LAB — DIFFERENTIAL
Basophils Relative: 0 % (ref 0–1)
Eosinophils Absolute: 0.1 10*3/uL (ref 0.0–0.7)
Eosinophils Relative: 1 % (ref 0–5)
Monocytes Relative: 7 % (ref 3–12)
Neutrophils Relative %: 76 % (ref 43–77)

## 2011-02-01 LAB — POCT I-STAT, CHEM 8
BUN: <3 mg/dL — ABNORMAL LOW (ref 6–23)
Calcium, Ion: 1.16 mmol/L (ref 1.12–1.32)
Chloride: 100 mEq/L (ref 96–112)
Potassium: 3.4 mEq/L — ABNORMAL LOW (ref 3.5–5.1)
Sodium: 136 mEq/L (ref 135–145)

## 2011-02-01 LAB — BASIC METABOLIC PANEL
BUN: 4 mg/dL — ABNORMAL LOW (ref 6–23)
CO2: 29 mEq/L (ref 19–32)
Calcium: 9.1 mg/dL (ref 8.4–10.5)
Chloride: 110 mEq/L (ref 96–112)
Creatinine, Ser: 0.5 mg/dL (ref 0.4–1.2)
GFR calc Af Amer: >60 mL/min (ref >60)
Glucose, Bld: 158 mg/dL — ABNORMAL HIGH (ref 70–99)

## 2011-02-01 LAB — URINALYSIS, ROUTINE W REFLEX MICROSCOPIC
Bilirubin Urine: NEGATIVE
Ketones, ur: NEGATIVE mg/dL
Nitrite: NEGATIVE
Urobilinogen, UA: 0.2 mg/dL (ref 0.0–1.0)
pH: 6 (ref 5.0–8.0)

## 2011-02-01 LAB — HEMOGLOBIN A1C
Hgb A1c MFr Bld: 5.7 % (ref 4.6–6.1)
Mean Plasma Glucose: 117 mg/dL

## 2011-02-01 LAB — VITAMIN B12: Vitamin B-12: 289 pg/mL (ref 211–911)

## 2011-02-01 LAB — COMPREHENSIVE METABOLIC PANEL
ALT: 11 U/L (ref 0–35)
Albumin: 3.2 g/dL — ABNORMAL LOW (ref 3.5–5.2)
Alkaline Phosphatase: 74 U/L (ref 39–117)
BUN: 3 mg/dL — ABNORMAL LOW (ref 6–23)
Chloride: 103 mEq/L (ref 96–112)
Glucose, Bld: 169 mg/dL — ABNORMAL HIGH (ref 70–99)
Potassium: 3.9 mEq/L (ref 3.5–5.1)
Sodium: 139 mEq/L (ref 135–145)
Total Bilirubin: 0.5 mg/dL (ref 0.3–1.2)

## 2011-02-01 LAB — CBC
HCT: 38.4 % (ref 36.0–46.0)
HCT: 44.3 % (ref 36.0–46.0)
Hemoglobin: 12.3 g/dL (ref 12.0–15.0)
MCHC: 32.2 g/dL (ref 30.0–36.0)
MCHC: 33.4 g/dL (ref 30.0–36.0)
MCV: 77.2 fL — ABNORMAL LOW (ref 78.0–100.0)
MCV: 78.5 fL (ref 78.0–100.0)
Platelets: 155 10*3/uL (ref 150–400)
Platelets: 162 10*3/uL (ref 150–400)
RBC: 4.11 MIL/uL (ref 3.87–5.11)
RBC: 4.94 MIL/uL (ref 3.87–5.11)
RBC: 5.74 MIL/uL — ABNORMAL HIGH (ref 3.87–5.11)
RDW: 15.2 % (ref 11.5–15.5)
WBC: 7.8 10*3/uL (ref 4.0–10.5)

## 2011-02-01 LAB — POCT CARDIAC MARKERS
CKMB, poc: <1 ng/mL — ABNORMAL LOW (ref 1.0–8.0)
Myoglobin, poc: 33 ng/mL (ref 12–200)
Troponin i, poc: <0.05 ng/mL (ref 0.00–0.09)

## 2011-02-01 LAB — GLUCOSE, CAPILLARY
Glucose-Capillary: 140 mg/dL — ABNORMAL HIGH (ref 70–99)
Glucose-Capillary: 148 mg/dL — ABNORMAL HIGH (ref 70–99)
Glucose-Capillary: 149 mg/dL — ABNORMAL HIGH (ref 70–99)
Glucose-Capillary: 157 mg/dL — ABNORMAL HIGH (ref 70–99)

## 2011-02-01 LAB — CULTURE, BLOOD (ROUTINE X 2): Culture: NO GROWTH

## 2011-02-01 LAB — RETICULOCYTES
RBC.: 4.73 MIL/uL (ref 3.87–5.11)
Retic Ct Pct: 0.9 % (ref 0.4–3.1)

## 2011-02-01 LAB — RAPID URINE DRUG SCREEN, HOSP PERFORMED
Cocaine: NOT DETECTED
Tetrahydrocannabinol: NOT DETECTED

## 2011-02-01 LAB — BLOOD GAS, ARTERIAL
Bicarbonate: 26.7 mEq/L — ABNORMAL HIGH (ref 20.0–24.0)
Drawn by: 232811
O2 Content: 2 L/min
O2 Saturation: 90.8 %
pCO2 arterial: 41.4 mmHg (ref 35.0–45.0)
pO2, Arterial: 61.7 mmHg — ABNORMAL LOW (ref 80.0–100.0)

## 2011-02-01 LAB — FOLATE: Folate: 10 ng/mL

## 2011-02-06 LAB — CBC
HCT: 38 % (ref 36.0–46.0)
Hemoglobin: 10.4 g/dL — ABNORMAL LOW (ref 12.0–15.0)
MCHC: 31 g/dL (ref 30.0–36.0)
MCHC: 32.2 g/dL (ref 30.0–36.0)
MCV: 77.3 fL — ABNORMAL LOW (ref 78.0–100.0)
MCV: 79.7 fL (ref 78.0–100.0)
Platelets: 277 10*3/uL (ref 150–400)
RBC: 4.91 MIL/uL (ref 3.87–5.11)
RDW: 15.6 % — ABNORMAL HIGH (ref 11.5–15.5)
RDW: 16 % — ABNORMAL HIGH (ref 11.5–15.5)
WBC: 12.6 10*3/uL — ABNORMAL HIGH (ref 4.0–10.5)

## 2011-02-06 LAB — GLUCOSE, CAPILLARY
Glucose-Capillary: 102 mg/dL — ABNORMAL HIGH (ref 70–99)
Glucose-Capillary: 131 mg/dL — ABNORMAL HIGH (ref 70–99)
Glucose-Capillary: 136 mg/dL — ABNORMAL HIGH (ref 70–99)
Glucose-Capillary: 140 mg/dL — ABNORMAL HIGH (ref 70–99)
Glucose-Capillary: 142 mg/dL — ABNORMAL HIGH (ref 70–99)
Glucose-Capillary: 153 mg/dL — ABNORMAL HIGH (ref 70–99)
Glucose-Capillary: 154 mg/dL — ABNORMAL HIGH (ref 70–99)
Glucose-Capillary: 164 mg/dL — ABNORMAL HIGH (ref 70–99)
Glucose-Capillary: 190 mg/dL — ABNORMAL HIGH (ref 70–99)
Glucose-Capillary: 196 mg/dL — ABNORMAL HIGH (ref 70–99)
Glucose-Capillary: 225 mg/dL — ABNORMAL HIGH (ref 70–99)
Glucose-Capillary: 336 mg/dL — ABNORMAL HIGH (ref 70–99)
Glucose-Capillary: 36 mg/dL — CL (ref 70–99)
Glucose-Capillary: 88 mg/dL (ref 70–99)
Glucose-Capillary: 89 mg/dL (ref 70–99)
Glucose-Capillary: 102 mg/dL — ABNORMAL HIGH (ref 70–99)
Glucose-Capillary: 111 mg/dL — ABNORMAL HIGH (ref 70–99)
Glucose-Capillary: 114 mg/dL — ABNORMAL HIGH (ref 70–99)
Glucose-Capillary: 134 mg/dL — ABNORMAL HIGH (ref 70–99)
Glucose-Capillary: 180 mg/dL — ABNORMAL HIGH (ref 70–99)
Glucose-Capillary: 84 mg/dL (ref 70–99)

## 2011-02-06 LAB — COMPREHENSIVE METABOLIC PANEL
ALT: 9 U/L (ref 0–35)
Alkaline Phosphatase: 73 U/L (ref 39–117)
CO2: 28 mEq/L (ref 19–32)
Calcium: 8.6 mg/dL (ref 8.4–10.5)
GFR calc non Af Amer: >60 mL/min (ref >60)
Glucose, Bld: 169 mg/dL — ABNORMAL HIGH (ref 70–99)
Sodium: 130 mEq/L — ABNORMAL LOW (ref 135–145)

## 2011-02-06 LAB — HEMOGLOBIN A1C: Hgb A1c MFr Bld: 5.6 % (ref 4.6–6.1)

## 2011-02-06 LAB — MAGNESIUM: Magnesium: 2.9 mg/dL — ABNORMAL HIGH (ref 1.5–2.5)

## 2011-02-06 LAB — URINALYSIS, ROUTINE W REFLEX MICROSCOPIC
Glucose, UA: NEGATIVE mg/dL
Ketones, ur: 15 mg/dL — AB
Leukocytes, UA: NEGATIVE
Nitrite: NEGATIVE
Protein, ur: 30 mg/dL — AB
Urobilinogen, UA: 1 mg/dL (ref 0.0–1.0)

## 2011-02-06 LAB — BASIC METABOLIC PANEL
BUN: 6 mg/dL (ref 6–23)
BUN: <1 mg/dL — ABNORMAL LOW (ref 6–23)
CO2: 29 mEq/L (ref 19–32)
CO2: 33 mEq/L — ABNORMAL HIGH (ref 19–32)
Calcium: 8.8 mg/dL (ref 8.4–10.5)
Calcium: 9.1 mg/dL (ref 8.4–10.5)
Chloride: 103 mEq/L (ref 96–112)
Chloride: 105 mEq/L (ref 96–112)
Chloride: 90 mEq/L — ABNORMAL LOW (ref 96–112)
Creatinine, Ser: 0.41 mg/dL (ref 0.4–1.2)
Creatinine, Ser: 0.63 mg/dL (ref 0.4–1.2)
GFR calc Af Amer: >60 mL/min (ref >60)
GFR calc non Af Amer: >60 mL/min (ref >60)
GFR calc non Af Amer: >60 mL/min (ref >60)
Glucose, Bld: 109 mg/dL — ABNORMAL HIGH (ref 70–99)
Glucose, Bld: 164 mg/dL — ABNORMAL HIGH (ref 70–99)
Potassium: 3.1 mEq/L — ABNORMAL LOW (ref 3.5–5.1)
Potassium: 4.3 mEq/L (ref 3.5–5.1)
Sodium: 141 mEq/L (ref 135–145)
Sodium: 143 mEq/L (ref 135–145)

## 2011-02-06 LAB — CARDIAC PANEL(CRET KIN+CKTOT+MB+TROPI)
CK, MB: 3.3 ng/mL (ref 0.3–4.0)
CK, MB: 3.4 ng/mL (ref 0.3–4.0)
Relative Index: INVALID (ref 0.0–2.5)
Total CK: 36 U/L (ref 7–177)
Troponin I: 0.11 ng/mL — ABNORMAL HIGH (ref 0.00–0.06)

## 2011-02-06 LAB — LIPID PANEL
HDL: 49 mg/dL (ref >39)
LDL Cholesterol: 72 mg/dL (ref 0–99)
Total CHOL/HDL Ratio: 2.7 RATIO
Triglycerides: 64 mg/dL (ref <150)
VLDL: 13 mg/dL (ref 0–40)

## 2011-02-06 LAB — URINE CULTURE
Colony Count: NO GROWTH
Culture: NO GROWTH

## 2011-02-06 LAB — HEPATIC FUNCTION PANEL
Alkaline Phosphatase: 81 U/L (ref 39–117)
Bilirubin, Direct: 0.3 mg/dL (ref 0.0–0.3)
Indirect Bilirubin: 0.7 mg/dL (ref 0.3–0.9)
Total Bilirubin: 1 mg/dL (ref 0.3–1.2)

## 2011-02-06 LAB — DIFFERENTIAL
Basophils Relative: 0 % (ref 0–1)
Eosinophils Absolute: 0.1 10*3/uL (ref 0.0–0.7)
Eosinophils Relative: 1 % (ref 0–5)
Lymphs Abs: 2.1 10*3/uL (ref 0.7–4.0)
Monocytes Relative: 11 % (ref 3–12)

## 2011-02-06 LAB — POCT I-STAT 3, ART BLOOD GAS (G3+)
Patient temperature: 100
pH, Arterial: 7.351 (ref 7.350–7.400)

## 2011-02-06 LAB — URINE MICROSCOPIC-ADD ON

## 2011-02-06 LAB — CLOSTRIDIUM DIFFICILE EIA

## 2011-03-02 ENCOUNTER — Emergency Department (HOSPITAL_COMMUNITY): Payer: Medicare Other

## 2011-03-02 ENCOUNTER — Inpatient Hospital Stay (HOSPITAL_COMMUNITY)
Admission: EM | Admit: 2011-03-02 | Discharge: 2011-03-14 | DRG: 208 | Disposition: A | Discharge status: Skilled Nursing Facility | Payer: Medicare Other | Attending: Critical Care Medicine | Admitting: Critical Care Medicine

## 2011-03-02 DIAGNOSIS — Z9119 Patient's noncompliance with other medical treatment and regimen: Secondary | ICD-10-CM

## 2011-03-02 DIAGNOSIS — J441 Chronic obstructive pulmonary disease with (acute) exacerbation: Secondary | ICD-10-CM | POA: Diagnosis present

## 2011-03-02 DIAGNOSIS — J9600 Acute respiratory failure, unspecified whether with hypoxia or hypercapnia: Principal | ICD-10-CM | POA: Diagnosis present

## 2011-03-02 DIAGNOSIS — F17200 Nicotine dependence, unspecified, uncomplicated: Secondary | ICD-10-CM | POA: Diagnosis present

## 2011-03-02 DIAGNOSIS — E46 Unspecified protein-calorie malnutrition: Secondary | ICD-10-CM | POA: Diagnosis present

## 2011-03-02 DIAGNOSIS — Z681 Body mass index (BMI) 19 or less, adult: Secondary | ICD-10-CM

## 2011-03-02 DIAGNOSIS — E0781 Sick-euthyroid syndrome: Secondary | ICD-10-CM | POA: Diagnosis present

## 2011-03-02 DIAGNOSIS — R5381 Other malaise: Secondary | ICD-10-CM | POA: Diagnosis present

## 2011-03-02 DIAGNOSIS — Z66 Do not resuscitate: Secondary | ICD-10-CM | POA: Diagnosis present

## 2011-03-02 DIAGNOSIS — F05 Delirium due to known physiological condition: Secondary | ICD-10-CM | POA: Diagnosis present

## 2011-03-02 DIAGNOSIS — E871 Hypo-osmolality and hyponatremia: Secondary | ICD-10-CM | POA: Diagnosis present

## 2011-03-02 DIAGNOSIS — Z91199 Patient's noncompliance with other medical treatment and regimen due to unspecified reason: Secondary | ICD-10-CM

## 2011-03-02 DIAGNOSIS — F1010 Alcohol abuse, uncomplicated: Secondary | ICD-10-CM | POA: Diagnosis present

## 2011-03-02 DIAGNOSIS — R627 Adult failure to thrive: Secondary | ICD-10-CM | POA: Diagnosis present

## 2011-03-02 LAB — COMPREHENSIVE METABOLIC PANEL
ALT: 15 U/L (ref 0–35)
BUN: 4 mg/dL — ABNORMAL LOW (ref 6–23)
CO2: 31 mEq/L (ref 19–32)
Calcium: 9.7 mg/dL (ref 8.4–10.5)
Glucose, Bld: 183 mg/dL — ABNORMAL HIGH (ref 70–99)
Sodium: 117 mEq/L — CL (ref 135–145)

## 2011-03-02 LAB — MRSA PCR SCREENING

## 2011-03-02 LAB — CBC
HCT: 42.1 % (ref 36.0–46.0)
Hemoglobin: 14 g/dL (ref 12.0–15.0)
MCH: 24.6 pg — ABNORMAL LOW (ref 26.0–34.0)
MCHC: 33.3 g/dL (ref 30.0–36.0)
MCV: 74.1 fL — ABNORMAL LOW (ref 78.0–100.0)

## 2011-03-02 LAB — BLOOD GAS, ARTERIAL
Acid-Base Excess: 3.9 mmol/L — ABNORMAL HIGH (ref 0.0–2.0)
Acid-base deficit: 0.7 mmol/L (ref 0.0–2.0)
Bicarbonate: 22.5 mEq/L (ref 20.0–24.0)
Drawn by: 21179
Drawn by: 30996
FIO2: 0.3 %
MECHVT: 500 mL
O2 Content: 2 L/min
O2 Saturation: 96.5 %
PEEP: 5 cmH2O
Patient temperature: 98.6
Patient temperature: 98.6
Patient temperature: 98.6
RATE: 12 resp/min
TCO2: 30.2 mmol/L (ref 0–100)
pCO2 arterial: 73.6 mmHg (ref 35.0–45.0)
pCO2 arterial: 36.1 mmHg (ref 35.0–45.0)
pH, Arterial: 7.267 — ABNORMAL LOW (ref 7.350–7.400)
pH, Arterial: 7.411 — ABNORMAL HIGH (ref 7.350–7.400)
pO2, Arterial: 82.1 mmHg (ref 80.0–100.0)

## 2011-03-02 LAB — BASIC METABOLIC PANEL
CO2: 25 mEq/L (ref 19–32)
Calcium: 8.4 mg/dL (ref 8.4–10.5)
Creatinine, Ser: <0.47 mg/dL (ref 0.4–1.2)

## 2011-03-02 LAB — URINALYSIS, ROUTINE W REFLEX MICROSCOPIC
Ketones, ur: >80 mg/dL — AB
Leukocytes, UA: NEGATIVE
Nitrite: NEGATIVE
Specific Gravity, Urine: 1.024 (ref 1.005–1.030)
Urobilinogen, UA: 1 mg/dL (ref 0.0–1.0)

## 2011-03-02 LAB — DIFFERENTIAL
Basophils Relative: 0 % (ref 0–1)
Eosinophils Absolute: 0 10*3/uL (ref 0.0–0.7)
Eosinophils Relative: 0 % (ref 0–5)
Lymphs Abs: 1 10*3/uL (ref 0.7–4.0)
Monocytes Absolute: 0.4 10*3/uL (ref 0.1–1.0)
Neutrophils Relative %: 77 % (ref 43–77)

## 2011-03-02 LAB — LIPASE, BLOOD: Lipase: 23 U/L (ref 11–59)

## 2011-03-02 LAB — URINE MICROSCOPIC-ADD ON

## 2011-03-02 LAB — ETHANOL: Alcohol, Ethyl (B): <11 mg/dL — ABNORMAL HIGH (ref 0–10)

## 2011-03-03 ENCOUNTER — Inpatient Hospital Stay (HOSPITAL_COMMUNITY): Payer: Medicare Other

## 2011-03-03 LAB — BASIC METABOLIC PANEL
BUN: 4 mg/dL — ABNORMAL LOW (ref 6–23)
Chloride: 95 mEq/L — ABNORMAL LOW (ref 96–112)
Potassium: 3.2 mEq/L — ABNORMAL LOW (ref 3.5–5.1)
Sodium: 131 mEq/L — ABNORMAL LOW (ref 135–145)

## 2011-03-03 LAB — BLOOD GAS, ARTERIAL
Acid-base deficit: 2.7 mmol/L — ABNORMAL HIGH (ref 0.0–2.0)
Drawn by: 317871
FIO2: 0.3 %
MECHVT: 0.6 mL
O2 Saturation: 97 %
RATE: 10 resp/min

## 2011-03-03 LAB — T3, FREE: T3, Free: 1.4 pg/mL — ABNORMAL LOW (ref 2.3–4.2)

## 2011-03-03 LAB — TSH: TSH: 0.243 u[IU]/mL — ABNORMAL LOW (ref 0.350–4.500)

## 2011-03-03 LAB — CBC
MCV: 74.1 fL — ABNORMAL LOW (ref 78.0–100.0)
Platelets: 167 10*3/uL (ref 150–400)
RBC: 4.95 MIL/uL (ref 3.87–5.11)
WBC: 5 10*3/uL (ref 4.0–10.5)

## 2011-03-03 LAB — T4, FREE: Free T4: 1.2 ng/dL (ref 0.80–1.80)

## 2011-03-03 LAB — RAPID URINE DRUG SCREEN, HOSP PERFORMED: Benzodiazepines: POSITIVE — AB

## 2011-03-04 ENCOUNTER — Inpatient Hospital Stay (HOSPITAL_COMMUNITY): Payer: Medicare Other

## 2011-03-04 LAB — URINE CULTURE: Culture: NO GROWTH

## 2011-03-05 DIAGNOSIS — J441 Chronic obstructive pulmonary disease with (acute) exacerbation: Secondary | ICD-10-CM

## 2011-03-05 DIAGNOSIS — J9600 Acute respiratory failure, unspecified whether with hypoxia or hypercapnia: Secondary | ICD-10-CM

## 2011-03-06 LAB — BASIC METABOLIC PANEL
BUN: 10 mg/dL (ref 6–23)
Chloride: 98 mEq/L (ref 96–112)
Potassium: 3.3 mEq/L — ABNORMAL LOW (ref 3.5–5.1)
Sodium: 140 mEq/L (ref 135–145)

## 2011-03-07 NOTE — H&P (Signed)
[name], [name] NO.:  [id number]   MEDICAL RECORD NO.:  [id number]          PATIENT TYPE:  EMS   LOCATION:  MAJO                         FACILITY:  [facility]   PHYSICIAN:  Vania Rea, M.D. DATE OF BIRTH:  [birth year]   DATE OF ADMISSION:  10/30/2008  DATE OF DISCHARGE:                              HISTORY & PHYSICAL   PRIMARY CARE PHYSICIAN:  Lorelle Formosa, M.D.   CHIEF COMPLAINT:  Shortness of breath.   HISTORY OF PRESENT ILLNESS:  This is a 70 year old lady with a history  of COPD with ongoing tobacco abuse and recurrent admissions for the  same.  Was discharged from this facility in July after treatment for  acute exacerbation of COPD and DVT.  Returns today with 3 days of  progressive cough productive of green sputum, associated fevers,  unrelieved by nebulizers, eventually came to the emergency room.  Was so  distressed, required BiPAP for several hours before she became  sufficiently stable.  The patient, other than the above, denies any  chest pain.  Denies any lower extremity swelling or edema.  Denies any  abdominal pain.  She does have progressive weight loss.   PAST MEDICAL HISTORY:  1. COPD and bronchitis.  2. Past history of DVT.  3. Tobacco abuse.   MEDICATIONS:  Albuterol and Atrovent nebulizers per the patient.  Also  ProAir inhaler.   ALLERGIES:  No known drug allergies.   SOCIAL HISTORY:  She smokes one-pack per day.  She drinks one case of  beer for at least 20 years.  Says she just loves drinking beer.  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   FAMILY HISTORY:  She denies any family history of any medical problems  including addiction.   REVIEW OF SYSTEMS:  Other than noted above, she admits only to  progressive weight loss.   PHYSICAL EXAMINATION:  GENERAL:  Very thin, undernourished, middle-aged  [place] lady, lying in the stretcher, extremely tachypneic.  She is now off BiPAP.  VITAL SIGNS:  Temperature is 99, pulse  100, respirations 20, blood  pressure 93/61.  She is saturating 100%.  HEENT:  Pupils are round and  equal.  Mucous membranes dry.  NECK:  No cervical lymphadenopathy or thyromegaly.  CHEST:  She has diffuse rhonchi.  CARDIOVASCULAR:  Tachycardiac.  ABDOMEN:  Scaphoid. Soft and nontender.  EXTREMITIES:  Wasted. She has 2+ dorsalis pedis pulses bilaterally.  CENTRAL NERVOUS SYSTEM:  No focal neurologic signs.   LABORATORY DATA:  White count is elevated to 12.6, hemoglobin 12.2, MCV  77, platelets 311,000.  Absolute neutrophil count is slightly elevated  to 9.  Her serum chemistry is significant for sodium of 127, potassium  3.1, chloride 90, CO2 29, glucose 171, BUN less than 1, creatinine 0.5.  Liver function significant for an albumin of 3; otherwise unremarkable.  Blood gas:  pH was 7.35,  pCO2 56, pO2 143, saturating at 99% on 100%  FIO2 and on BiPAP.  Chest x-ray shows COPD without focal pulmonary  opacity.   ASSESSMENT:  1. Acute respiratory failure.  2. Acute on chronic  bronchitis  3. Tobacco abuse.  4. Alcohol abuse.  5. Past history of deep venous thrombosis.   PLAN:  Will admit this lady for control of her respiratory symptoms with  steroid, serial nebulizations and other respiratory support.  Will go  ahead and give her antibiotics in view of her symptomatology and will  also give treatment for alcohol abuse including B vitamins and  benzodiazepines support as this lady is very diffident when asked about  the possibility of delirium tremor.  Other plans as per orders.      Vania Rea, M.D.  Electronically Signed     LC/MEDQ  D:  10/31/2008  T:  10/31/2008  Job:  [id number]   cc:   Lorelle Formosa, M.D.

## 2011-03-07 NOTE — Discharge Summary (Signed)
NAMESHAMRA, [name]              ACCOUNT NO.:  [id number]   MEDICAL RECORD NO.:  [id number]          PATIENT TYPE:  INP   LOCATION:  1507                         [facility]ACILITY:  [facility]   PHYSICIAN:  Lonia Blood, M.D.       DATE OF BIRTH:  12/15/1940   DATE OF ADMISSION:  04/24/2008  DATE OF DISCHARGE:  05/04/2008                               DISCHARGE SUMMARY   PATIENT'S PRIMARY CARE PHYSICIAN:  Lorelle Formosa, M.D.   DISCHARGE DIAGNOSES:  1. Acute respiratory failure, resolved.  2. Chronic obstructive pulmonary disease exacerbation, resolved.  3. Chronic obstructive pulmonary disease.  Needs outpatient pulmonary      function testing to assess severity, but I do suspect it is either      moderate or severe.  4. Pneumonia, resolved.  5. Anemia, needs outpatient workup, esophagogastroduodenoscopy, and      colonoscopy.  6. Popliteal vein deep venous thrombosis of the left lower extremity.  7. Tobacco abuse.   DISCHARGE MEDICATIONS:  1. Advair 250/50, 1 puff twice a day.  2. Nicotine patch 21 mg daily.  3. Spiriva 1 capsule inhaled daily.  4. Albuterol 1 puff 3 times a day.  5. Prilosec OTC 20 mg daily.  6. Lovenox 80 mg subcutaneously at 3 p.m. for 5 days.  7. Multivitamin daily.  8. Coumadin 5 mg tablets, to take at 6 p.m. daily, and to see Dr.      Billee Cashing on May 05, 2008 for a PT/INR check.   PROCEDURES DONE THIS ADMISSION:  1. On April 30, 2008, the patient underwent a CT scan of the chest,      which was negative for pulmonary emboli, with moderate COPD      findings  2. On April 30, 2008, lower extremity venous Dopplers, which were      positive for left lower extremity venous thrombosis.   CONSULTATIONS THIS ADMISSION:  No consultations were obtained.   HISTORY AND PHYSICAL:  Refer to the dictated H&P done by Dr. Toniann Fail  on April 24, 2008.   HOSPITAL COURSE:  For the first 5 days of the hospital stay, refer to  the dictated discharge summary done by Dr.  Toniann Fail done on April 29, 1008.   FURTHER HOSPITAL COURSE:  [name] continued to improve throughout  this later part of the hospitalization.  She was weaned off oxygen, with  good oxygen saturations on room air.  The patient also completed a  course of 10 days of antibiotics.  The patient also completed a steroid  taper.  The H1N1 test came back negative.  [name] was transitioned  to chronic COPD medications, and would recommend outpatient pulmonary  function testing, to quantify the severity of the disease.   In regards to [name]'s anemia, she did have a stable hemoglobin  throughout this part of the hospitalization.  She definitely needs  outpatient workup with colonoscopy.   In regards to [name]'s deep venous thrombosis, she was started on  anticoagulation with Lovenox and Coumadin, which were carefully  titrated.  Her INR at the time of  discharge is still subtherapeutic,  with the level being 1.3.  [name] was told to follow up with her  primary care physician on May 05, 2089 for further recommendations on  Coumadin dosing.      Lonia Blood, M.D.  Electronically Signed     SL/MEDQ  D:  05/04/2008  T:  05/04/2008  Job:  [id number]   cc:   Lorelle Formosa, M.D.  Fax: [phone]

## 2011-03-07 NOTE — H&P (Signed)
[name], [name]              ACCOUNT NO.:  [id number]   MEDICAL RECORD NO.:  [id number]          PATIENT TYPE:  EMS   LOCATION:  ED                           [facility]ACILITY:  [facility]   PHYSICIAN:  Eduard Clos, MDDATE OF BIRTH:  07-30-1941   DATE OF ADMISSION:  04/24/2008  DATE OF DISCHARGE:                              HISTORY & PHYSICAL   CHIEF COMPLAINT:  Shortness of breath.   HISTORY OF PRESENT ILLNESS:  A 70 year old female with known history of  COPD, ongoing tobacco abuse, who presented to the ER complaining of  increasing shortness of breath.  Patient has been having shortness of  breath over the last few days which was increasing in intensity,  increased by walking.  Persisting at rest today, when she decided to  come to the ER.  Patient denies any cough or productive sputum but does  have chest pain when she takes a deep breath.  The chest pain is  otherwise not present.  Denies any radiation of the chest pain.   In the ER, the patient was found to be short of breath with tachycardia.  The patient has been admitted for further management of her COPD  exacerbation and possible pneumonia.  The patient denies any abdominal  pain, dizziness, loss of consciousness, weakness of the limbs, but she  has been noticing having some diarrhea every day.  Denies any nausea and  vomiting.  She also noticed that she may have had some fever and chills  the last two days.   PAST MEDICAL HISTORY:  COPD.   PAST SURGICAL HISTORY:  Tubal ligation.   MEDICATIONS PER ADMISSION:  Albuterol nebulizer p.r.n.   SOCIAL HISTORY:  Patient smokes cigarettes.  Drinks alcohol  occasionally.  Denies any drug abuse.   FAMILY HISTORY:  Nothing contributory.   REVIEW OF SYSTEMS:  As per the history of present illness.  Nothing else  significant.   PHYSICAL EXAMINATION:  Patient examined at the bedside.  Appears mildly  short of breath.  VITAL SIGNS:  Blood pressure 112/70, pulse 120 per  minute, temperature  101.1, respirations 22 per minute.  O2 sat 98%.  HEENT:  Anicteric.  No pallor.  CHEST:  Bilateral air entry present.  Bilateral expiratory wheeze heard.  No crepitations.  HEART:  S1 and S2 heard.  ABDOMEN:  Soft.  Nontender.  Bowel sounds heard.  No guarding, no  rigidity.  CNS:  Alert and oriented to time, place, and person.  Moves upper and  lower extremities 5x5.  EXTREMITIES:  Peripheral pulses felt.  No edema.   LABS:  Chest x-ray:  Decreasing bibasilar opacities with previous  findings likely related to atelectasis, stable COPD.  This was the chest  x-ray done the second time today.  The first chest x-ray shows severe  COPD with partial septal bibasilar infiltrates.   CBC:  WBC 8.8, hemoglobin 13.4, hematocrit 41, platelets 204.  Complete  metabolic panel:  Sodium 129, potassium 2.8, chloride 86, carbon dioxide  33, glucose 109, BUN 3, creatinine 0.58, total bilirubin 1.6, alkaline  phosphatase 79, AST 17, ALT 10, total  protein 7.3, albumin 3, calcium  9.5.  Urine:  Ketones 15, leukocytes moderate.  WBCs 3-6, bacteria rare.   ASSESSMENT:  1. Acute respiratory failure secondary to chronic obstructive      pulmonary disease exacerbation.  2. Bilateral pneumonia.  3. Hyponatremia, probably from dehydration.  4. Dehydration, probably from diarrhea.  5. Ongoing tobacco abuse.  6. Hypokalemia.   PLAN:  1. Admit patient to step-down unit to team C.  2. Will place patient on IV steroids, IV antibiotics, including      Rocephin and Zithromax.  3. Place patient on bronchodilator.  4. Draw-blood precautions, H1N1 screening.  5. Will add Tamiflu.  6. Patient has been advised to quit smoking.  7. ABG has been reordered.  Will follow ABGs.   Further recommendations as the patient's conditions evolves.      Eduard Clos, MD  Electronically Signed     ANK/MEDQ  D:  04/24/2008  T:  04/24/2008  Job:  [id number]

## 2011-03-07 NOTE — H&P (Signed)
NAMETAKITA, [name]              ACCOUNT NO.:  [id number]   MEDICAL RECORD NO.:  [id number]          PATIENT TYPE:  INP   LOCATION:  0101                         FACILITY:  [facility]   PHYSICIAN:  Vania Rea, M.D. DATE OF BIRTH:  08-22-1941   DATE OF ADMISSION:  02/17/2009  DATE OF DISCHARGE:                              HISTORY & PHYSICAL   PRIMARY CARE PHYSICIAN:  Lorelle Formosa, M.D.   CHIEF COMPLAINT:  Respiratory distress.   HISTORY OF THE PRESENT ILLNESS:  This is a 70 year old African American  lady with a history of COPD and tobacco abuse who for the past 2 days  has been having persistent cough and shortness of breath.  The cough is  productive of green sputum.  The patient states she is also having chest  pains at rest.  She has been using Ventolin metered dose inhaler at home  and that has not relieved her symptoms.  Eventually she called the EMS  and was brought to the emergency room.  The patient denies any fever or  chills.  She notes that she has cut down on her drinking considerably,  but continues to smoke one pack of cigarettes per day.  She does not use  home oxygen.   PAST MEDICAL HISTORY:  The past medical history is significant for:  1. COPD.  2. Asymptomatic tachycardia.  3. Alcohol abuse.  4. Tobacco abuse.  5. Chronically elevated troponins.  6. Past history of DVT.   MEDICATIONS:  ProAir inhaler.   ALLERGIES:  No known drug allergies.   SOCIAL HISTORY:  The patient continues to smoke one pack of cigarettes  per day.  She drinks one 40-ounce can of beer per day.  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   FAMILY HISTORY:  The patient denies any family history of any medical  problems at all.   REVIEW OF SYSTEMS:  Other than that noted above a 10-point review of  systems is unremarkable.   PHYSICAL EXAMINATION:  GENERAL APPEARANCE:  The patient is an elderly  African American lady lying on the stretcher and she is very tachypneic.  VITAL SIGNS:  The  patient's vital signs are; temperature is 100.5, her  pulse is 17, her pulse is 126, her respirations are 24, and she is  saturating at 99% on 2 liters.  HEENT:  The patient's pupils are round and equal.  Mucous membranes are  pink and anicteric.  She is mildly dehydrated.  NECK:  The neck reveals no cervical lymphadenopathy or thyromegaly.  She  has distended external jugular veins.  No carotid bruits.  CHEST:  The patient has diffuse rhonchi bilaterally.  HEART:  Cardiovascular system - tachycardiac.  ABDOMEN:  The patient's abdomen is scaphoid, soft and nontender with no  masses.  EXTREMITIES:  The extremities are without edema.  She has 2+ dorsalis  pedis pulses bilaterally.  NEUROLOGIC EXAMINATION:  Central nervous system - cranial nerves II-XII  are grossly intact and she has no focal neurological  deficit.   LABORATORY DATA:  On the patient's labs her white count is 9.4,  hemoglobin is  14.8, MCV is 77, platelets are 162,000, and she has a  normal differential on her white count.  Her serum chemistries are  reviewed and are significant only for a potassium of 3.4, glucose of  147, her BUN was undetectable, her creatinine was 0.8, and her pCO2 was  28.  Arterial blood gas on 2 liters; pH was 7.425, pCO2 was 41 and pO2  was 61.7, and she was saturating at 98%.  Cardiac enzymes were  repeatedly normal with undetectable troponins and undetectable CK/MB.  Portable chest x-ray showed COPD with no acute findings and no change  from chest x-ray done in January 69 2010.   ASSESSMENT:  1. Acute hypoxic respiratory failure.  2. Acute exacerbation of chronic obstructive pulmonary disease.  3. Hypokalemia.  4. Hyperglycemia.  5. Continued tobacco abuse.  6. Alcohol abuse.   PLAN:  1. We will admit this lady to a telemetry unit for intravenous fluid      hydration, serial nebulizations and oxygen supplementation.  2. We will go ahead and give steroids, and monitor her serum glucose.  3.  Because of her chest pain we will go ahead and cycle her cardiac      enzymes.  Her chest pain is probably related to the work of      breathing.  4. We will put her on an alcohol withdrawal protocol because of her      history of alcohol use.  5. We will give her nicotine replacement therapy.  6. Other plans will be as per orders.      Vania Rea, M.D.  Electronically Signed     LC/MEDQ  D:  02/18/2009  T:  02/18/2009  Job:  [id number]   cc:   Lorelle Formosa, M.D.  Fax: [phone]

## 2011-03-07 NOTE — Discharge Summary (Signed)
NAMETANGANYIKA, BOWLDS              ACCOUNT NO.:  [id number]   MEDICAL RECORD NO.:  [id number]          PATIENT TYPE:  INP   LOCATION:  1511                         FACILITY:  [facility]   PHYSICIAN:  Eduard Clos, MDDATE OF BIRTH:  Aug 14, 1941   DATE OF ADMISSION:  04/24/2008  DATE OF DISCHARGE:  04/29/2008                               DISCHARGE SUMMARY   HOSPITAL COURSE:  A 70 year old female with known history of COPD and  ongoing tobacco abuse, who presented with increasing shortness of breath  to the ER.  On admission, the patient had a chest x-ray done which  showed bilateral infiltrates and features of COPD.  The patient was very  short of breath.  Initially she had to be on 100% nonrebreather and  eventually was able to be placed on nasal cannula.  The patient was  admitted to a medical floor.  The patient on admission also was found to  have a low sodium of 129 which was attributed to dehydration because the  patient had mild diarrhea before admission.  The sodium gradually  improved.  The patient was placed on Doppler precaution with H1N1  screening.  The patient was started on antibiotics along with Tamiflu  for H1N1.  Presently, the H1N1 is still pending.  Blood cultures were  obtained during admission which did not show any growth.  Sputum culture  showed polymicrobial growth.  The patient also was placed on IV steroid  because of her wheezing which was changed to p.o. steroids.  At this  time, the patient's condition has improved.  Her antibiotics will be  changed to p.o. tomorrow.  The patient at this time is complaining of  some swelling in both her thighs.  We will do Doppler of both lower  extremities to rule out DVT.  H1N1 is still pending.  However, the  patient was placed on Tamiflu during this stay.   FINAL DIAGNOSES:  1. Acute respiratory failure secondary to chronic obstructive      pulmonary disease exacerbation.  2. Pneumonia.  3. Hyponatremia secondary  to dehydration.  4. Hyperglycemia secondary to steroids.  5. Anemia.   PLAN:  Bilateral lower extremity Dopplers have been ordered to rule out  DVT.  Pending H1N1 tests, further recommendation based on these results.  The discharge recommendation and medications will be dictated by the  discharging MD.      Eduard Clos, MD  Electronically Signed     ANK/MEDQ  D:  04/29/2008  T:  04/29/2008  Job:  [id number]

## 2011-03-07 NOTE — Discharge Summary (Signed)
[name], [name]              ACCOUNT NO.:  [id number]   MEDICAL RECORD NO.:  [id number]          PATIENT TYPE:  INP   LOCATION:  1415                         [facility]AC[facility]:  [facility]   PHYSICIAN:  Isidor Holts, M.D.  DATE OF BIRTH:  09-Mar-1941   DATE OF ADMISSION:  02/17/2009  DATE OF DISCHARGE:  02/27/2009                               DISCHARGE SUMMARY   ADDENDUM:   PRIMARY CARE PHYSICIAN:  Lorelle Formosa, M.D.   For discharge diagnoses, refer to interim summary dictated Feb 25, 2009.  Refer also to above summary for discharge medications.  For details of  clinical course, refer to interim summary of Feb 23, 2009, dictated by  Dr. Midge Minium as well as above-mentioned interim summary of Feb 25, 2009.  On Feb 26, 2009, the patient was asymptomatic, was deemed  clinically stable for discharge; however, on measurement of vital signs  she was found to have a BP of 87/54.  This was rechecked manually, was  found to be 84/50 mmHg.  Her medications were reviewed.  She did not  appear to be on any culprit medications.  Hemoglobin was checked and was  found to be 11.6, effectively militating against an acute bleed against  a background of anticoagulant therapy.  She was bolused with intravenous  normal saline and started on a maintenance normal saline infusion with  satisfactory clinical response.  Intravenous fluids were discontinued  today, Feb 27, 2009.  The patient continues to remain asymptomatic.  Blood pressure was reasonable at 92/60 on Feb 27, 2009, and the patient  had no orthostatic symptoms.  Troponins cycled for completeness, was  unelevated.  Physical examination revealed a clear chest.  The patient  had been reevaluated by PT/OT on Feb 26, 2009, and was felt to have made  appreciable progress; however, she continues to experience O2  desaturation ambulation, thus requiring continued oxygen therapy.   DISPOSITION:  The patient was considered clinically stable for  discharge  to short-term SNF on Feb 27, 2009.  She has therefore been discharged  accordingly.   MEDICATIONS:  Refer to summary of Feb 25, 2009, with the following  modifications:  Coumadin dosage has been changed to 7.5 mg p.o. at 6  p.m. daily.  She is recommended PT/INR check on Mar 01, 2009.   All other discharge medications and instructions remain valid, as do  followup instructions and special discharge instructions.      Isidor Holts, M.D.  Electronically Signed     CO/MEDQ  D:  02/27/2009  T:  02/27/2009  Job:  [id number]   cc:   Lorelle Formosa, M.D.  Fax: [phone]

## 2011-03-07 NOTE — Discharge Summary (Signed)
NAMEANNIBELLE, [name]              ACCOUNT NO.:  [id number]   MEDICAL RECORD NO.:  [id number]          PATIENT TYPE:  INP   LOCATION:  3008                         FACILITY:  [facility]   PHYSICIAN:  Altha Harm, MDDATE OF BIRTH:  07/22/1941   DATE OF ADMISSION:  10/30/2008  DATE OF DISCHARGE:  11/10/2008                               DISCHARGE SUMMARY   DISCHARGE DISPOSITION:  [facility].   DISCHARGE DIAGNOSES:  1. Acute exacerbation of chronic obstructive pulmonary disease (COPD),      resolved.  2. Hypoxia, resolved.  3. Tachycardia, asymptomatic.  4. Alcohol abuse and withdrawal, resolved.  5. Tobacco use disorder.  6. Mildly elevated troponins - nonischemic cause.  7. Right hip pain, resolved.  8. Generalized weakness and ataxic gait.   DISCHARGE MEDICATIONS:  Include the following:  1. Prednisone 20 mg p.o. daily times one, then 10 mg p.o. daily times      two, then stop.  2. Folic acid 1 mg p.o. daily.  3. Thiamine 100 mg p.o. daily.  4. Aspirin 81 mg p.o. daily.  5. Advair 250/50 one puff p.o. b.i.d.  6. Mucinex 600 mg p.o. daily p.r.n.  7. Protonix 40 mg p.o. daily.  8. Albuterol MDI p.o. two puffs q.4h. p.r.n.  9. Atrovent MDI two puffs p.o. q.8h.   CONSULTANTS:  Cardiology.   PROCEDURES:  None.   DIAGNOSTIC STUDIES:  1. 2-D echocardiogram done on November 02, 2008 which shows overall      left ventricular systolic function is vigorous with an ejection      fraction ranging between 60-70%.  2. Portable chest x-ray done on admission which shows COPD without      focal pulmonary opacity.  3. X-ray of the right hip which shows no acute abnormalities.   CODE STATUS:  Full code.   ALLERGIES:  NO KNOWN DRUG ALLERGIES.   PRIMARY CARE PHYSICIAN:  Dr. Madaline Savage.   CHIEF COMPLAINT:  Shortness of breath.   HISTORY OF PRESENT ILLNESS:  Please refer to the H and P dictated by Dr.  Vania Rea for details of the HPI.   HOSPITAL COURSE:  1. Acute exacerbation of COPD.  The patient came in and was quite      hypoxic, in respiratory distress secondary to her acute      exacerbation of COPD.  The patient was started on IV steroids, beta      agonists, and Atrovent.  The patient was also treated with      antibiotics empirically for tracheobronchitis.  The patient      improved over several days and was eventually weaned off of for      oxygen support and is on prednisone taper as noted above.  The      patient appears to be quite compensated the right now.  The patient      has received tobacco cessation counseling while hospitalized and      has been using a nicotine patch in the hospital which seemed to be      rather effective in staving off the patient's  cravings for      nicotine.  2. Elevated troponins.  The patient had some mildly elevated      troponins.  She was seen by cardiology and they assessed that this      was a nonischemic cause.  No further workup was instituted on the      patient.  3. Alcohol abuse and withdrawal.  The patient habitually uses alcohol      and while hospitalized showed some symptoms of alcohol withdrawal.      The patient was treated with benzodiazepines in tapering doses and      has had no symptoms of alcohol withdrawal for at least the last 4      days.  4. Right hip pain.  The patient complained of right hip pain where she      had fallen.  An evaluation was done and there was no point      tenderness.  The patient received a x-ray of the right hip which      showed no abnormalities.  5. Ataxic gait and generalized weakness.  The patient had an ataxic      gait pattern likely secondary to chronic alcohol use and has      generalized weakness which is likely secondary to her prolonged      bedrest.  Recommendations were made by physical therapy for      continued PT and OT for regaining a functional level and thus the      patient is being transferred to a skilled  nursing facility for      further rehab.  6. Sinus tachycardia.  The patient has had a persistent sinus      tachycardia.  It has been completely asymptomatic.  She was seen by      cardiology who felt that there was no intervention or workup      necessary on this patient.   DIETARY RESTRICTIONS:  The patient is on no dietary restrictions.   PHYSICAL RESTRICTIONS:  Activity as determined by physical therapy.   FOLLOWUP:  The patient is to follow up with her primary care physician  on a p.r.n. basis.   Total time that this discharge 37 minutes.      Altha Harm, MD  Electronically Signed     MAM/MEDQ  D:  11/10/2008  T:  11/10/2008  Job:  [id number]

## 2011-03-07 NOTE — H&P (Signed)
[name], [name]              ACCOUNT NO.:  [id number]   MEDICAL RECORD NO.:  [id number]          PATIENT TYPE:  INP   LOCATION:  6710                         FACILITY:  [facility]   PHYSICIAN:  [name], M.D. DATE OF BIRTH:  October 29, 1940   DATE OF ADMISSION:  08/18/2007  DATE OF DISCHARGE:                              HISTORY & PHYSICAL   PRIMARY CARE PHYSICIAN:  [name], M.D.   CHIEF COMPLAINT:  Shortness of breath.   HISTORY OF PRESENT ILLNESS:  This is a 70 year old female who was  brought to the emergency department secondary to worsening shortness of  breath.  She reports having shortness of breath worsening over the past  24 hours along with cough productive of yellowish sputum.  She denies  having any fevers or chills.  She does report having wheezing and chest  tightness.  She denies having any fevers, chills, nausea, vomiting,  diarrhea, weight loss, syncope or seizures.   PAST MEDICAL HISTORY:  1. COPD/asthma.  2. Tobacco abuse.   PAST SURGICAL HISTORY:  History of bilateral tubal ligation.   MEDICATIONS:  Albuterol inhaler p.r.n.   ALLERGIES:  NO KNOWN DRUG ALLERGIES   SOCIAL HISTORY:  The patient is a smoker one pack per day for 50 years.  Nondrinker.   FAMILY HISTORY:  Positive for coronary artery disease in her father who  died of an MI.  No history of hypertension, diabetes mellitus or cancer  in her family that she knows of.   REVIEW OF SYSTEMS:  Pertinent for mentioned above.   PHYSICAL EXAMINATION FINDINGS:  GENERAL:  This is a thin 70 year old  older-than-stated-age-appearing female in acute distress.  VITAL SIGNS:  Temperature 100.3, blood pressure 140/83, heart rate 114-  116, respirations 18, O2 saturations 95% to 98%.  HEENT:  Normocephalic, atraumatic.  Pupils equally round, reactive to  light.  Extraocular muscles are intact.  Funduscopic benign.  Oropharynx  is clear.  NECK:  Supple.  Full range of motion.  No thyromegaly,  adenopathy or  jugular venous distention.  CARDIOVASCULAR:  Tachycardiac rate and rhythm.  No murmurs, gallops or  rubs.  LUNGS:  Decreased breath sounds bilaterally.  Breathing is labored.  No  expiratory wheezes at this time.  Occasional rhonchi.  ABDOMEN:  Positive bowel sounds, soft, nontender, nondistended.  EXTREMITIES:  Without cyanosis, clubbing or edema.  NEUROLOGIC:  Alert and oriented x3.  There are no focal deficits.   LABORATORY DATA:  White blood cell count of 8.0, hemoglobin 12.1,  hematocrit 36.4, platelets 208, MCV 74.2.  Sodium 138, potassium 3.0,  chloride 104, carbon dioxide 29, BUN 3, creatinine 0.49 and glucose of  123.  Albumin 3.7, AST 19.  Arterial blood gas performed reveals a pH of  7.414, pCO2 of 43.3, pO2 of 56.0, bicarbonate of 27.7, O2 saturation  89%.   CHEST X-RAY:  Chest x-ray findings reveal COPD changes but no active  disease process.   ASSESSMENT:  A 70 year old female being admitted with:  1. Shortness of breath.  2. Hypoxia.  3. Asthma exacerbation.  4. Hypokalemia.  5. Microcytic anemia.  PLAN:  The patient will be admitted to the telemetry area for cardiac  monitoring.  Continue with O2 monitoring has been ordered.  Supplemental  oxygen has also been ordered.  The patient will be placed on a high dose  IV steroid taper along with nebulizer treatments, and IV antibiotic  therapy has been started empirically with Rocephin and azithromycin.  P.R.N. medications for congestion and cough have also been ordered.  A  nicotine patch has also been ordered, and smoking cessation has been  discussed with the patient.  GI and DVT prophylaxis has also been  ordered.      [name], M.D.  Electronically Signed     HJ/MEDQ  D:  08/18/2007  T:  08/19/2007  Job:  [id number]   cc:   [name], M.D.

## 2011-03-07 NOTE — Discharge Summary (Signed)
NAMESENG, FOUTS              ACCOUNT NO.:  [id number]   MEDICAL RECORD NO.:  [id number]          PATIENT TYPE:  INP   LOCATION:  1415                         FACILITY:  [facility]   PHYSICIAN:  Isidor Holts, M.D.  DATE OF BIRTH:  01-Dec-1940   DATE OF ADMISSION:  02/17/2009  DATE OF DISCHARGE:  02/25/2009                               DISCHARGE SUMMARY   PRIMARY CARE PHYSICIAN:  Billee Cashing, M.D.   DISCHARGE DIAGNOSES:  1. Acute bronchitis.  2. Infective exacerbation of chronic obstructive pulmonary disease,      secondary to #1.  3. Acute hypoxic respiratory failure, secondary to #1 and #2 above.  4. Alcohol abuse/withdrawal.  5. Smoking history.  6. Chronic iron deficiency anemia.  7. 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  8. Deconditioning.   HOSPITAL COURSE:  For details of clinical course and procedures, refer  to interim discharge summary dictated January 24, 2009, by Dr. Midge Minium.  However, for the period from Feb 24, 2009 to Feb 25, 2009,  the following are pertinent:  The patient's clinical condition remained  stable.  She improved in well-being.  Respiratory status stabilized.  The patient was saturating as high as 100% on 2L of oxygen, but tended  to desaturate to 80% on ambulation.  Her hypoxic respiratory failure has  significantly improved and as of Feb 24, 2009, she was no longer  tachycardic.  The tachycardia was attributed to a combination of  patient's acute problems, against a background of alcohol withdrawal.  Hemoglobin remained stable/reasonable, and as a matter of fact, was 10.5  with a hematocrit of 32.3 on Feb 25, 2009.  She no longer showed any  evidence of alcohol withdrawal phenomenon.  She has been counseled  appropriately, and managed with Thiamine supplementation.  In addition,  the patient has been counseled with regards to smoking, and was managed  with Nicoderm CQ patch.  Bilateral lower extremity venous Doppler of  Feb 23, 2009 showed deep venous thromboses in the left common femoral and  popliteal veins, with superficial thrombosis in the greater and lesser  saphenous veins.  The femoral veins appeared patent and compressible.  The right lower extremity was negative for DVT.  It appears therefore,  that the patient has developed recurrent left lower extremity DVT, as  venous Doppler's of July, 2009, reported showing a DVT in a short  segment of the left popliteal vein.  She has therefore being commenced  on anticoagulation, accordingly.  Repeat chest x-ray of Feb 24, 2009  showed changes of COPD, there was a new small left pleural effusion seen  with left basilar atelectasis, heart size remained normal.  There was no  evidence of pneumonic consolidation.  The patient's cough had  considerably ameliorated as of Feb 24, 2009.  She was therefore  transitioned to oral steroid taper as well as oral Avelox.  On Feb 25, 2009, she was on day #7 of a planned 14 day course of Avelox.  She was  evaluated by PT/OT on Feb 24, 2009, and was found to be significantly  deconditioned and  desaturating on ambulation.  It is likely she will  require a short term oxygen supplementation, however, she is recommended  a period of rehabilitation in a short term skilled nursing facility  prior to discharge home.   DISPOSITION:  As of Feb 25, 2009, the patient was considered sufficiently  recovered and stable to be discharged to short term skilled nursing  facility.  This is anticipated to occur on Feb 25, 2009 or Feb 26, 2009.   DISCHARGE MEDICATIONS:  1. Nicoderm CQ patch (21 mg/24h) 1 patch to skin daily.  2. Thiamine 100 mg p.o. daily.  3. Xopenex HFA inhaler, 2 puffs p.r.n. q.6h.  4. Spiriva HandiHaler, one capsule daily.  5. Xopenex 0.63 mg bronchodilator nebulizer 1 treatment p.r.n. q.4-6h.  6. Flovent inhaler (110 mcg) 1 puff b.i.d.  7. Lovenox 50 mg subcutaneously q.12h. until Feb 27, 2009 inclusive, to      complete  5 day overlap with concomitant Coumadin therapy and then      continued until INR is therapeutic between 2.0 to 3.0 for 48 hours,      then stop.  8. Coumadin per INR, currently 5 mg p.o. at 6 P.M. daily.  9. Mucinex 600 mg p.o. b.i.d. to be completed on Mar 04, 2009.  10.Avelox 400 mg p.o. daily to be completed on Mar 04, 2009.  11.Prednisone 30 mg p.o. daily until Feb 27, 2009, then 20 mg p.o.      daily from Feb 28, 2009 to Mar 02, 2009, then 10 mg p.o. daily from      May 12, to Mar 05, 2009 and then stop.  12.Ensure Strawberry liquid 237 mL p.o. t.i.d.   DIET:  Regular.   ACTIVITY:  As tolerated, otherwise per physical therapy, occupational  therapy, rehabilitation.   FOLLOWUP INSTRUCTIONS:  The patient is to follow up routinely with her  primary care physician, Dr. Billee Cashing.   SPECIAL DISCHARGE INSTRUCTIONS:  The patient is recommended to have  PT/INR checked in the A.M. of Feb 27, 2009.  Nursing facility M.D. will  adjust Coumadin dosage thereafter, as indicated.  Otherwise, PT/INR  should be checked per skilled nursing facility protocol.  In addition,  the patient is recommended short term oxygen at 2 to 4L per minute via  nasal cannula and continued physical therapy and occupational  therapy/rehabilitation.      Isidor Holts, M.D.  Electronically Signed     CO/MEDQ  D:  02/25/2009  T:  02/25/2009  Job:  [id number]   cc:   Lorelle Formosa, M.D.  Fax: [phone]

## 2011-03-07 NOTE — Discharge Summary (Signed)
NAMEMARI, [name]              ACCOUNT NO.:  [id number]   MEDICAL RECORD NO.:  [id number]          PATIENT TYPE:  INP   LOCATION:  6710                         FACILITY:  [facility]   PHYSICIAN:  [name], D.O.    DATE OF BIRTH:  07/10/1941   DATE OF ADMISSION:  08/17/2007  DATE OF DISCHARGE:  08/20/2007                               DISCHARGE SUMMARY   PRIMARY CARE PHYSICIAN:  Dr. Ronne [name].   FINAL DIAGNOSES:  1. Asthmatic bronchitis with COPD exacerbation and tobacco abuse.  2. Hypokalemia resolved.   MEDICATIONS ON DISCHARGE:  1. Prednisone taper starting at 40 mg daily and tapering by 10 mg q. 3      days, until complete, dispense him 30.  2. Avelox 400 mg daily or a substitution as permitted, dispense #10.  3. Spiriva hand inhaler, one inhaled daily.  4. She is to resume her albuterol that she was on prior to arrival.  5. Metered-dose inhaler 2 puffs q.i.d. p.r.n.  6. Vitamin B12 that she was on prior to arrival.   DISPOSITION:  She was instructed stop smoking and instructed to follow  up with Dr. Ronne [name] in one week following discharge.   HISTORY OF PRESENT ILLNESS:  For full details, please refer to the H&P  as dictated by Dr. Della [name].  However briefly, [name] is  a 69 year old female who was brought to the emergency department  secondary to worsening shortness of breath.  She reported having  shortness of breath over the past 24 hours with cough productive of  yellow sputum.  She did not have any fevers and chills.  She reported  having wheezing and chest tightness.   HOSPITAL COURSE:  She was admitted and initiated on IV steroids,  initially on IV antibiotics.  Her chest radiograph was not revealing of  any infiltrate, subsequently transitioned to oral Avelox.  She underwent  frequent nebulizer treatments with improvement of her symptoms, though  she continues to have a residual wheezing on exam, but she is ambulatory  without refractory  shortness of breath.  She did have a blood gas in the  emergency room that revealed a pH of 7.41 and a pCO2 0.3.  Her BNP was  less than 30, and her cardiac markers were negative.  Her EKG this  admission revealed sinus tachycardia, rate of 119, nonspecific ST-T  abnormalities.  At this time, she is markedly improved and stable for  discharge.  To follow up with her primary care physician.     [name], D.O.  Electronically Signed    ESS/MEDQ  D:  08/20/2007  T:  08/20/2007  Job:  [id number]   cc:   Dr. Ronne [name]

## 2011-03-07 NOTE — Consult Note (Signed)
NAMEZORA, GLENDENNING              ACCOUNT NO.:  [id number]   MEDICAL RECORD NO.:  [id number]          PATIENT TYPE:  INP   LOCATION:  2917                         FACILITY:  [facility]   PHYSICIAN:  Pricilla Riffle, MD, FACCDATE OF BIRTH:  06/02/1941   DATE OF CONSULTATION:  10/31/2008  DATE OF DISCHARGE:                                 CONSULTATION   IDENTIFICATION:  The patient is a 70 year old woman, who we are asked to  see regarding abnormal cardiac enzymes.   HISTORY OF PRESENT ILLNESS:  The patient has no known history of  coronary artery disease.  She was admitted to the Medicine Service on  October 31, 2008, for question URI.  She reports having cold-like  symptoms greater than 10 days with increased cough, increased wheezing  this week.  Cough had some production of greenish sputum.  She felt  feverish.  She also began having chest pain with cough, worse actually  in certain positions, no pain though with deep inspiration.  She has not  had a previous history of chest pain.  She has also noted a feeling of  her heart rate increase with occasional skips, occasionally she gets  dizzy.  No falls, she will rest, hold on to something, and symptoms  will resolve in less than 5 minutes.  Again, no syncope.   ALLERGIES:  None.   MEDICATIONS:  Medications now in the hospital include:  1. Ventolin.  2. Atrovent.  3. Zithromax.  4. Rocephin.  5. Librium 25 q.6.  6. Lovenox 40 daily.  7. Advair b.i.d.  8. Folic acid b.i.d.  9. Guaifenesin b.i.d.  10.Solu-Medrol 60 q.6.  11.Nicotine patch.  12.Protonix 40.  13.K-Dur 20.  14.Senokot.   PAST MEDICAL HISTORY:  1. Reported COPD.  2. History of pneumonia.  3. History of DVT in the left popliteal.  4. History of tobacco use.  5. Question anemia.   SOCIAL HISTORY:  The patient lives in [place].  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  She has a greater than 50-pack-a-year history of smoking, drinks  about a 6 packs per day of beer.   FAMILY  HISTORY:  Mother died at age 50, question COPD.  Father died at  age 75 of an MI.   REVIEW OF SYSTEMS:  Weight decrease significant over the past several  months.  Note, no appetite.  Chest pressure, shortness of breath as  noted.  Notes dyspnea on exertion, some orthopnea.  No PND, some  palpitations as noted.  No syncope, but does get dizzy.  Otherwise, all  systems reviewed negative to the above problem except as noted above.   PHYSICAL EXAMINATION:  GENERAL:  The patient is a frail, 70 year old  appearing older than stated age.  She is not in any acute distress.  VITAL SIGNS:  Blood pressure is 88/53, pulse is 112 and regular,  temperature is 97.6, and O2 sat on 2 liters is 99%.  HEENT:  Some temporal wasting.  EOMI.  PERRL.  Mucous membranes are dry.  NECK:  JVP approximately 6.  No bruits.  No thyromegaly.  LUNGS:  Some rhonchi, occasional rales, and wheeze bilaterally.  CARDIAC:  Regular rate and rhythm, S1 and S2.  No S3.  No significant  murmurs.  ABDOMEN:  Mild right upper quadrant tenderness.  No hepatomegaly.  No  masses.  EXTREMITIES:  Good distal pulses throughout.  No lower extremity edema.  Feet warm.  NEUROLOGIC:  Alert and oriented x3.  Cranial nerves II through XII  intact.  Moving all extremities, otherwise deferred.   Chest x-ray shows COPD.  A 12-lead EKG, sinus tachycardia, 108 beats per  minute.  No acute ST changes.  Initial EKG shows some motion artifact  what appears to be sinus tachycardia, 117 beats per minute.   Labs significant for hemoglobin of 12.2, WBC of 12.6, BUN and creatinine  less than 1 and 0.62, potassium of 3.3.  CK-MB a 41 and 4.3, troponin  0.29.   IMPRESSION:  The patient is a cachectic 70 year old, no acute distress.  Comes in for evaluation of upper respiratory tract infection-like  symptoms, has had chest pain which is atypical for cardiac.  There is  some exacerbation by position and cough.  I am not convinced of any  acute  ischemia.  Her troponin is minimally elevated, would follow.  On  examination today, she is hypotensive, pulse in the 110, concerning that  she may be dehydrated.  She is frail.  She has some wheezes and rhonchi  bilaterally though she is moving air.  No evidence for congestive heart  failure.  EKG again without acute arrhythmia, no acute ST changes.   I agree with echocardiogram to evaluate, would start aspirin 81 mg  daily.  Continue telemetry, hydrate, follow heart rate and BP, treat  pulmonary.  Continue to check CK troponin.  We will continue to follow.      Pricilla Riffle, MD, [facility]  Electronically Signed     PVR/MEDQ  D:  10/31/2008  T:  10/31/2008  Job:  [id number]

## 2011-03-07 NOTE — Group Therapy Note (Signed)
NAMEJORA, [name]              ACCOUNT NO.:  [id number]   MEDICAL RECORD NO.:  [id number]          PATIENT TYPE:  INP   LOCATION:  1415                         [facility]AC[facility]   PHYSICIAN:  Eduard Clos, MDDATE OF BIRTH:  07-10-1941                                 PROGRESS NOTE   COURSE IN THE HOSPITAL:  A 70 year old female with known history of COPD, asymptomatic  tachycardia, previous history of elevated troponins chronically, alcohol  abuse, tobacco abuse, history of DVT presented with complaint of ongoing  shortness of breath with productive sputum.  The patient on exam was  found to have bilateral expiratory wheeze and was admitted for COPD  exacerbation.  Started on empiric antibiotics.  Eventually, the patient  was also on IV steroids, along with bronchodilators.  The patient's  condition slowly, but gradually improved, but at this time still the  patient is very weak.  The patient is becoming very tachycardic and had  to start on IV fluids and the patient does have a history of chronic  asymptomatic tachycardia.   During her stay, the patient also had some chest pressure.  Cardiac  enzymes and EKG were don which showed no acute features.  A 2-D echo was  done and the 2-D echo just showed an EF of 65%, wall motion was normal.  There was no regional wall motion abnormality.  At this time, OT and PT  consults are being requested.  OT consult already obtained and we are  pending PT consult.  The patient's shortness of breath has improved.  We  will change Solu-Medrol to p.o. prednisone at this time.  We will  continue with antibiotics.  We will follow physical therapy's  recommendation as the patient still easily gets short of breath on  minimal exertion and we will see if the patient qualifies for skilled  nursing facility.  If so, then we may have to transfer her to a skilled  nursing facility for a few days for rehab.   At the time of this dictation, the patient  is hemodynamically stable.   PROCEDURES DONE DURING THIS STAY:  1. A 2-D echo on February 19, 2009, showed left ventricle cavity size was      normal.  Wall thickness was normal.  Systolic function was normal.      EF was 65%.  No wall motion abnormality.  2. Chest x-ray on February 17, 2009, showed COPD, no acute findings.  No      change.  3. CT of the chest done on February 17, 2009, showed no evidence of      pulmonary embolism.  COPD, chronic bronchitic changes, bibasilar      atelectasis or scarring, coronary artery disease.  4. X-ray of the pelvis on February 19, 2009, showed a stable exam.  No      osseous findings.  5. X-ray of the lumbosacral spine, February 19, 2009, showed osteopenia      with mild transverse hypertrophy.   FINAL DIAGNOSES:  1. Acute hypoxic respiratory failure secondary to chronic obstructive      pulmonary disease exacerbation.  2. Chronic asymptomatic tachycardia.  3. Iron deficiency anemia.  4. Chronic alcohol abuse.  5. History of tobacco abuse.   PLAN:  At this time, we are following pending PT consult.  We will follow the  recommendations.  If the patient qualifies for  SNF then probably we will make arrangements for that.  At this time, IV  Solu-Medrol has been changed to p.o. prednisone and we will continue  present medications.      Eduard Clos, MD  Electronically Signed     ANK/MEDQ  D:  02/23/2009  T:  02/23/2009  Job:  [id number]

## 2011-03-08 LAB — BASIC METABOLIC PANEL
CO2: 39 mEq/L — ABNORMAL HIGH (ref 19–32)
CO2: 42 mEq/L (ref 19–32)
Calcium: 9.4 mg/dL (ref 8.4–10.5)
Chloride: 91 mEq/L — ABNORMAL LOW (ref 96–112)
Chloride: 92 mEq/L — ABNORMAL LOW (ref 96–112)
Creatinine, Ser: <0.47 mg/dL (ref 0.4–1.2)
GFR calc Af Amer: >60 mL/min (ref >60)
Glucose, Bld: 144 mg/dL — ABNORMAL HIGH (ref 70–99)
Potassium: 2.4 mEq/L — CL (ref 3.5–5.1)
Potassium: 3.1 mEq/L — ABNORMAL LOW (ref 3.5–5.1)
Sodium: 136 mEq/L (ref 135–145)

## 2011-03-08 LAB — PHOSPHORUS: Phosphorus: 1.1 mg/dL — ABNORMAL LOW (ref 2.3–4.6)

## 2011-03-09 LAB — LACTIC ACID, PLASMA: Lactic Acid, Venous: 2 mmol/L (ref 0.5–2.2)

## 2011-03-10 LAB — BASIC METABOLIC PANEL
BUN: 5 mg/dL — ABNORMAL LOW (ref 6–23)
Chloride: 98 mEq/L (ref 96–112)
Potassium: 4 mEq/L (ref 3.5–5.1)
Sodium: 139 mEq/L (ref 135–145)

## 2011-03-10 LAB — PHOSPHORUS: Phosphorus: 2.2 mg/dL — ABNORMAL LOW (ref 2.3–4.6)

## 2011-03-10 NOTE — Op Note (Signed)
[name], [name]              ACCOUNT NO.:  [id number]   MEDICAL RECORD NO.:  [id number]          PATIENT TYPE:  INP   LOCATION:  4707                         FACILITY:  [facility]   PHYSICIAN:  Petra Kuba, M.D.    DATE OF BIRTH:  September 21, 1941   DATE OF PROCEDURE:  07/18/2006  DATE OF DISCHARGE:                                 OPERATIVE REPORT   PROCEDURE:  Esophagogastroduodenoscopy with biopsy.   INDICATION:  Anemia and weight loss.  Consent was signed after risks,  benefits, methods and options thoroughly discussed yesterday in the  hospital.   MEDICINES USED:  Fentanyl 25 mcg, Versed 4 mg.   PROCEDURE:  The video endoscope was inserted by direct vision.  She may have  had just a touch of Candida esophagitis and possibly a tiny hiatal hernia.  The scope was passed into the stomach and advanced through a normal pylorus  into the duodenal bulb pertinent for a minimal amount of bulb inflammation  but no ulcer, around the C loop to a normal second portion of the duodenum.  No blood was seen on insertion.  A few scattered duodenal biopsies were  obtained.  The scope was withdrawn back to the bulb and a good look there  confirmed the above findings.  The scope was withdrawn back to the stomach  in retroflex.  Cardia, fundus, angularis, lesser and greater curve were  evaluated on retroflex and then straight visualization.  Other than a  minimal amount of gastritis in the introitus, no abnormalities were seen.  Air was suctioned, the scope slowly withdrawn.  Again a good look at the  esophagus was normal except for the minimal trace of esophageal candidiasis.  The scope was removed.  The patient tolerated the procedure excellently.  There was no obvious immediate complication.   ENDOSCOPIC DIAGNOSES:  1. Tiny hiatal hernia with trace esophageal Candida.  2. Minimal gastritis bulbitis.  3. Otherwise normal esophagogastroduodenoscopy status post duodenal      biopsy.   PLAN:  Await  pathology.  Colonoscopy tomorrow.  Nystatin swish and swallow 5  cc q.i.d. and if colonoscopy should be nondiagnostic, probably a CT scan  next just to rule out anything big or bad.           ______________________________  Petra Kuba, M.D.     MEM/MEDQ  D:  07/18/2006  T:  07/18/2006  Job:  [id number]   cc:   [facility], [employer]

## 2011-03-10 NOTE — Consult Note (Signed)
[name], [name]              ACCOUNT NO.:  [id number]   MEDICAL RECORD NO.:  [id number]          PATIENT TYPE:  INP   LOCATION:  1507                         FACILITY:  [facility]   PHYSICIAN:  Theresia Bough, MD       DATE OF BIRTH:  10-08-1941   DATE OF CONSULTATION:  09/08/2006  DATE OF DISCHARGE:                                   CONSULTATION   REASON FOR CONSULTATION:  To clear patient for hip fracture repair.   PRESENTING COMPLAINT:  Right hip pain and fracture.   HISTORY OF PRESENT ILLNESS:  This is a 70 year old African American female  patient who has a history of anemia and COPD from chronic smoking presenting  with right hip pain.  She fell accidentally this evening while she was  getting out of her car.  She complained of pain 7/10 in severity at the  right hip.  The pain is worse with movement of the right hip.  The fall was  primarily an accident this evening.  There was no loss of consciousness.  She denies any headaches, no dizziness, no blurring of vision.  No chest  pain.  No shortness of breath.  No cough, no wheezing, no abdominal pain.  She denies any dysuria.  There was no feet swelling.  No leg swelling.  She  denies any skin abrasions.   ALLERGIES:  NO KNOWN DRUG ALLERGIES.   HOME MEDICATIONS:  1. Albuterol inhaler two puffs as needed three to four times daily.  2. Ferrous sulfate 325 mg twice daily.  3. Protonix 40 mg once daily.  4. Folic acid 1 mg daily.  5. Sometime last month she got Avelox to treat respiratory infection.  At      this time she has been cleared of the respiratory infection.   PAST MEDICAL HISTORY:  1. COPD.  She has been smoking one to one and a half packs of cigarettes      per day for several years.  2. History of anemia from iron-deficiency.  She takes iron pills at home.   FAMILY HISTORY:  Father has history of hypertension.  He died of old age.   SOCIAL HISTORY:  She smokes one to one and a half pack of cigarettes per  day.   She lives with her son.   PHYSICAL EXAMINATION:  VITAL SIGNS:  Blood pressure 106/72, pulse rate 86,  respirations 20, temperature 97.5, oxygen saturation 94% on room air.  Patient is not on home oxygen.  HEENT:  Conjunctivae are pink.  No jaundice.  Moist mucous membranes.  NECK:  Supple.  No JVD.  CHEST:  __________ respiration and is clear to auscultation bilaterally.  No  wheezing.  No crackles.  CARDIOVASCULAR:  Normal heart sounds, no murmur and no gallop.  Pulses are  palpable at the feet.  ABDOMEN:  Soft and nontender, no masses palpable.  Patient has normal bowel  sounds.  EXTREMITIES:  Tenderness at the right hip.  This is worse with movement.  CENTRAL NERVOUS SYSTEM:  Patient is alert and oriented to time, place and  person.  Speech is clear.  Tongue is midline.  Sensations are intact.  Power  is normal in all limbs.  She is not able to move the right thigh because of  pain.   Blood work like CBC, CMP, PT, PTT and INR are pending.   Chest x-ray shows emphysema, otherwise no acute disease.  Hip x-ray shows  comminuted displaced proximal right femoral fracture.   ASSESSMENT:  1. Right femoral fracture per orthopedics.  2. History of chronic obstructive pulmonary disease.  Patient is not on      home oxygen.  Patient is not on steroids.  Her oxygen saturation is 94%      on room air.  She has no wheezing.  It looks like she has a mild case      of chronic obstructive pulmonary disease.  Patient should be able to      undergo anesthesia but would suggest spinal anesthesia.  3. History of anemia.  Patient is taking iron at home.  A CBC is pending      at this time.  Also patient is going to need prophylaxis for deep      venous thrombosis with Lovenox after surgery.  4. Patient denies any history of coronary artery disease.  She denies any      history of chest pain or chest tightness.  Her EKG is normal and she      has no history of heart disease.  Her blood pressure is  normal and      there is no history of diabetes mellitus.  No history of high      cholesterol.  Patient has no history to suggest a possible coronary      artery disease at this time.      Theresia Bough, MD  Electronically Signed     GA/MEDQ  D:  09/08/2006  T:  09/09/2006  Job:  [id number]

## 2011-03-10 NOTE — Op Note (Signed)
[name], [name]              ACCOUNT NO.:  [id number]   MEDICAL RECORD NO.:  [id number]          PATIENT TYPE:  INP   LOCATION:  1507                         FACILITY:  [facility]   PHYSICIAN:  Leonides Grills, M.D.     DATE OF BIRTH:  [birth year]   DATE OF PROCEDURE:  09/09/2006  DATE OF DISCHARGE:                               OPERATIVE REPORT   PREOPERATIVE DIAGNOSIS:  Right reverse subtrochanteric/comminuted  peritrochanteric proximal femur fracture.   POSTOPERATIVE DIAGNOSES:  Right reverse subtrochanteric/comminuted  peritrochanteric proximal femur fracture.   OPERATION:  IM nailing, right subtrochanteric/peritrochanteric proximal  femur fracture.   ANESTHESIA:  General.   SURGEON:  Leonides Grills, M.D.   ASSISTANT:  Evlyn Kanner, P.A.   ESTIMATED BLOOD LOSS:  Minimal.   COMPLICATIONS:  None.   IMPLANT:  DePuy ACE trochanteric nail; 9 mm x 360 mm 130 degrees with  end-cap placed and 85 mm x 10.5 mm lag screw.   DISPOSITION:  Stable to PR.   INDICATIONS:  This is a 70 year old female who slipped and fell and  sustained the above injury yesterday.  She was admitted to initially the  doctor in [place] service, and referred to me for further evaluation and  treatment. She was consented for the above procedure. All risks,  including infection, neurovascular injury, nonunion, malunion, hardware  rotation, hardware failure, screw head cutout, stiffness or arthritis,  persistent and worsening pain following recovery, DVT, PE and possible  death were all explained.  Questions were encouraged and answered.   DESCRIPTION OF PROCEDURE:  The patient was brought to the operating room  and placed in the supine position, after adequate general endotracheal  anesthesia was administered, as well as Ancef 1 gram IV piggyback.  The  patient was then placed on a Conway table.  All bony prominences were  well padded.  Traction was applied to the well padded right foot, and  left lower  extremity was then in a lithotomy position.  Traction was  applied and the fracture was reduced under C-arm guidance in the AP and  lateral planes.  They then prepped and draped the right hip in a sterile  manner.  We started the procedure with longitudinal incision to the  proximal aspect of the greater trochanter.  Dissection was carried out  through skin.  Hemostasis was obtained.  The fascia lata was opened in  line with the incision with a curved Kelly clamp.  Interval was  developed in the __________ and to the tip of the greater trochanter.  A  guide wire was then placed; this was then drilled proximally with a 7 mm  drill.  This was done with the fracture reduced to try and help maintain  the reverse of __________ in a reduced pattern; and, try to prevent the  proximal portion of the nail from displacing the fracture into varus.  We then placed a vaulted guide wire and placed this down the femoral  shaft.  This was verified in the AP and lateral planes to be  intramedullary.  We then reamed with a 9 mm reamer, and then measured  the length of the nail.  This was measured to be approximately 360 mm in  length.  We were thinking about going larger, but decided against it.  We then placed a 9 x 360 mm Ace DePuy long trochanteric right-sided  nail, with a 130 degree lag screw.  Once this was placed, this was  verified under C-arm guidance to be in excellent position.  We  maintained the reduction of the lateral component of the proximal femur,  with a Cobb elevator that was placed through a small 2 cm incision over  the lateral portion of the femur.  This held the fracture reduced  anatomically. This was done during the drilling and reaming as well.   Once the nail was placed, we then made another incision just distal to  this -- to place the lag screw.  This was done through the outrigger as  well.  We placed a guide wire under C-arm guidance in AP and lateral  planes in the center  of the head.  And then drilled over the guide wire.  We then placed the 85 x 10.5 mm lag screw after this was tapped.  This  had excellent purchase and maintenance of reduction once the Cobb was  removed as well.  Final x-rays were obtained in the proximal aspect;  this was also done after an end cap was applied.   We then __________ distally, and by free-hand technique placed a  proximal hole locking screw (this was a 40 mm long screw).  This was  found to be in the center of the holes in the AP and lateral plane.   The wounds were copiously irrigated with normal saline.  Final x-rays  were obtained in AP and lateral planes.  Subcutaneous was closed with 2-  0 Vicryl.  The skin was closed with staples.  Sterile dressing was  applied. The patient was stable to the RR.      Leonides Grills, M.D.  Electronically Signed     PB/MEDQ  D:  09/09/2006  T:  09/09/2006  Job:  [id number]

## 2011-03-10 NOTE — H&P (Signed)
NAMEMEIGAN, PATES              ACCOUNT NO.:  [id number]   MEDICAL RECORD NO.:  [id number]          PATIENT TYPE:  EMS   LOCATION:  MAJO                         FACILITY:  [facility]   PHYSICIAN:  Lonia Blood, M.D.DATE OF BIRTH:  03-27-1941   DATE OF ADMISSION:  07/14/2006  DATE OF DISCHARGE:                                HISTORY & PHYSICAL   PRIMARY CARE PHYSICIAN:  Dr. Billee Cashing.   CHIEF COMPLAINT:  Shortness of breath.   HISTORY OF PRESENT ILLNESS:  [name] is a very pleasant 70-year-  old female with a longstanding history of heavy tobacco abuse.  She has been  admitted to the hospital multiple times in the past for trouble breathing.  She reports that approximately a week ago she began to develop a cold.  She describes symptoms of upper respiratory congestion and cough  intermittently productive of whitish sputum.  There was no hemoptysis.  This  continued for approximately 1 week.  Starting yesterday, the congestion  seemed to worsen.  It became associated with shortness of breath.  The  patient noticed significant wheezing.  The wheezing was heard by others in  her family.  She used her albuterol metered-dose inhaler on multiple  occasions yesterday with minimal relief.  Today she awoke and felt more  short of breath than her baseline.  Attempts to improve her symptoms with  albuterol failed.  As a result, EMS was consulted and the patient was  transported to [facility] Emergency Room for further evaluation.  En route,  the patient was given albuterol neb.  She reported this made her feel  somewhat better.  In the emergency room, she was given further neb  treatments.  Solu-Medrol was administered.   At the present time, the patient is resting comfortably on a hospital  stretcher in the emergency room.  She does complain of ongoing shortness of  breath.  She does not yet feel that she is back to her baseline.  She is  continuing to cough and  producing intermittent clear sputum.  She feels that  she has significant head congestion.  She admits that she continues to smoke  a pack a day as she has done since she was a teenager.  There is no chest  pain.  There have been no fevers or chills.  There is no nausea or vomiting.  There is no headache.   REVIEW OF SYSTEMS:  [name] is markedly cachectic.  When asked about  her appetite, she says that she just doesn't eat a whole lot.  She further  admits that she has been undergoing a significant amount of stress in her  life lately due to a recent marital separation.  This has significantly  decreased her appetite as well.  She reports that she has lost a good bit  of weight but cannot further describe the amount.  She states that this has  been over the course of the past 1 to 2 months.  She further admits,  however, that she has never been a big girl.  Comprehensive review of  systems  is otherwise unremarkable, with the exception of the multiple  positive elements noted in the history of present illness above.   PAST MEDICAL HISTORY:  1. Severe COPD.      a.     Classic findings on chest x-ray.      b.     A 50-pack-year history plus.      c.     Multiple prior hospitalizations for exacerbations.      d.     No history of intubation.  2. Longstanding history of tobacco abuse with failed prior attempts to      discontinue in the past.  3. Status post bilateral tubal ligation.   MEDICATIONS:  Albuterol metered-dose inhaler p.r.n.   ALLERGIES:  No known drug allergies.   FAMILY HISTORY:  The patient's mother is deceased with lung trouble and  died at the age of 57.  The patient's father is deceased from an MI at age  65.  There is no significant family history of GI, colon, ovarian,  pancreatic, or lung cancer per her report.   SOCIAL HISTORY:  The patient lives in [place] with a son at present.  She  is separated from her husband.  She reports that she occasionally  drinks  alcohol.  While she is stating this, her daughter in the back shakes her  head in disagreement.  When I push the patient more, she admits she drinks  usually every day.  When asked how much she consumes, she is evasive.  I  get the suggestion from further questioning that she drinks in excess of a 6-  pack a day on regular occasions.  She denies the use of illicit drugs.  Tobacco history:  As noted above.   PATIENT DATA REVIEW:  BNP is 47, D-dimer is unremarkable.  Point of care  cardiac markers are negative x1.  A pH is 7.39, PCO2 is 47, with a PO2 of  75.  Sodium, potassium, chloride, bicarb, BUN, and creatinine are normal.  Serum glucose is 127 which is elevated.  Chest x-ray reveals classic  findings of COPD with no acute infiltrate.  A 12-lead EKG reveal sinus  tachycardia at 113 beats per minute with no evidence of acute ST or T wave  change.   PHYSICAL EXAMINATION:  VITAL SIGNS:  Temperature is 97.4, blood pressure is  134/86, heart rate is 120, respiratory rate is 22.  O2 sats are 98% on room  air.  GENERAL:  Cachectic female in mild respiratory distress at the present time.  HEENT:  Normocephalic, atraumatic.  Pupils equal, round, and reactive to  light and accommodation.  Extraocular muscles are intact bilaterally.  OC/OP  clear.  NECK:  No JVD.  No lymphadenopathy.  No thyromegaly.  LUNGS:  The patient is in a moderate degree of respiratory distress.  She is  using accessory muscles to assist with respiration.  There is audible  external wheezing that can be heard at the bedside.  On exam, the patient  exhibits a markedly prolonged expiratory phase.  Good air movement is able  to be heard throughout all fields, however.  Breath sounds are distant.  There are no focal crackles.  CARDIAC:  Tachycardic at approximately 115 beats per minute with a regular  rhythm without murmur, gallop, or rub appreciable, but heart sounds are  distant. ABDOMEN:  Markedly thin, soft,  and bowel sounds are present.  There is no  hepatosplenomegaly.  There is no organomegaly.  Otherwise, bowel sounds  are  present.  There is no rebound.  The abdomen is nondistended and is soft.  EXTREMITIES:  No significant cyanosis, clubbing, or edema of bilateral lower  extremities.  NEUROLOGIC:  Cranial nerves, II through XII, are intact bilaterally.  The  patient is alert, oriented x4.  There is 5/5 strength in bilateral upper and  lower extremities with intact sensation of touch throughout.   IMPRESSION AND PLAN:  1. Acute chronic obstructive pulmonary disease exacerbation - the patient      is suffering with an acute exacerbation of her known chronic      obstructive pulmonary disease.  This likely was set up for a viral      upper respiratory infection over the last week.  The patient does have      a standing history of significant chronic bronchitis with frequent      acute flares.  Given this, I have chosen to continue the antibiotics      that were started in the emergency room.  I will treat the patient with      Avelox.  She will also be treated with IV Solu-Medrol and inhaled      albuterol and Atrovent on a standing and p.r.n. basis.  We will check a      magnesium level.  At the moment, the patient's bronchospasm does,      however, appear to be improving with the treatments that she has been      afforded and therefore I do not feel that magnesium infusion stands to      offer her much more.  2. Severe chronic obstructive pulmonary disease - chest x-ray and clinical      exam confirm a severe baseline chronic obstructive pulmonary disease.      I have advised the patient that she needs to discontinue smoking      completely and immediately.  I have explained to her that should her      COPD progress further, she will quickly become oxygen dependent and      that the quality of her life will diminish significantly as she will      become short of breath even when sitting  still.  She voices      understanding.  3. Tobacco abuse - the patient has been counseled extensively by      disposition as to the multiple deleterious effects of ongoing tobacco      abuse.  She has been educated on the direction connection between her      tobacco abuse and her emphysema and how it relates to her present      admission.  She has been educated on how continuing tobacco abuse will      simply cause her lung disease to progress.  She has been advised to      discontinue tobacco use immediately.  Tobacco cessation consultation      will be requested during this hospital stay to further encourage the      patient in this regard.  4. Cachexia - the patient is markedly cachectic.  This could be a      pulmonary cachexia.  This could also be a result of a psychosocial      stressor which is diminishing her appetite in the form of her marital      separation.  I will, however, check a TSH.  We will check an albumin as     well.  The possibility  of an occult cancer will be considered.      Followup chest x-ray will be obtained again tomorrow.  We will consider      CT scan of the chest if there is no further explanation for the      patient's remarkable malnourished state.  5. Alcohol abuse - I am suspicious that Ms. Trulson may in fact be an      alcoholic.  She will not admit to this to me in person.  Her family      member who is present with her suggests that she drinks much more than      she will admit.  She does admit to me to drinking an entire 6-pack      within 1 day every now and then.  Family's body language suggests      that this is a very frequent occurrence.  I have preemptively counseled      the patient on alcohol abuse and how drinking in excess of 1 alcoholic      drink per day is detrimental to her health.  I will empirically dose      her with thiamine and folate.  We will continue to address this issue      with her.  6. Hyperglycemia - the patient has  no known history of diabetes mellitus.      A serum glucose is elevated at 127 in the emergency room.  This may      simply be the result of administration of systemic steroids.  We will      check a hemoglobin A1c.  If this is elevated, we will consider CBG      monitoring.      Lonia Blood, M.D.  Electronically Signed     JTM/MEDQ  D:  07/14/2006  T:  07/16/2006  Job:  [id number]   cc:   Lorelle Formosa, M.D.

## 2011-03-10 NOTE — Op Note (Signed)
NAMEJANICE, [name]              ACCOUNT NO.:  [id number]   MEDICAL RECORD NO.:  [id number]          PATIENT TYPE:  INP   LOCATION:  4707                         FACILITY:  [facility]   PHYSICIAN:  Bernette Redbird, M.D.   DATE OF BIRTH:  [birth year]   DATE OF PROCEDURE:  07/19/2006  DATE OF DISCHARGE:                                 OPERATIVE REPORT   PROCEDURE:  Colonoscopy with polypectomy, biopsy and directed submucosal  injection.   INDICATIONS:  A 70 year old female admitted with anemia, with negative  endoscopy yesterday.   FINDINGS:  Two large colonic polyps, one of which was not removed in its  entirety.   DESCRIPTION OF PROCEDURE:  The nature, purpose and risks of the procedure  had been reviewed with the patient who provided written consent.  Sedation  during this 2-hour procedure was fentanyl 100 mcg and Versed 10 mg  administered prior to and during the course of the procedure.  In addition,  epinephrine 8 mL submucosally was injected during the course of the  procedure, of which probably 6 mL was actually delivered.   The Olympus adjustable tension pediatric video colonoscope was advanced  around the colon to the cecum as identified by typical cecal appearance, the  ileocecal valve and the absence of further lumen.   Pullback was then performed.   The quality of the prep was mediocre.  There was a fair amount of  particulate material scattered here and there which prevented complete  suctioning.  However, overall, it is felt that most areas were well seen.   On the way in, I encountered a diminutive sessile polyp removed by cold  biopsy in the proximal colon.   The main finding on this exam was a pedunculated 4-5 cm polyp in the cecum  on a generous stalk.  Because of this and because of the mobility of the  lesion, I thought that colonoscopic removal was feasible.  The stalk was  injected with several mL's of epinephrine with excellent blanching and bleb  formation.  We then applied an Endoloop around the stalk and cinched it up  tight.  It did actually involve a small portion of the polyp.  We then  proceeded to carve the polyp piecemeal since the snare could not go around  it entirely.  Two large pieces, each about 2 cm in diameter were removed,  along with perhaps 3 or 4 smaller 5-8  mm fragments.  At the end, however,  there was still about a 1.5 cm chunk of residual polyp on the backside of  the stalk which we could pull in to view but when we would release it, it  would flop backwards and we could not get the snare around it.  After  approximately 20 or 30 minutes of trying to excise that piece of tissue, it  was decided to terminate the procedure.   Note that the patient was placed in various positions and the forceps were  used to pull it forward, etc.   There was another rather large pedunculated polyp measuring about 3 cm in  diameter at 22  cm from the external anal opening, again on a generous stalk  which was injected with several mL's of epinephrine.  This polyp was able to  be snared off in one piece and retrieved with the [facility] retrieval basket.   No diverticular disease, colitis or vascular ectasia were noted.   There was no bleeding at any of the polypectomy sites.   The patient tolerated this procedure well and, despite a history of COPD and  some residual pneumonia, there was no evidence of clinical instability,  desaturation, hypoxemia or other problems of that nature during the course  this exam.   Retroflexion was not performed in the rectum due to a small rectal ampulla.   There were no apparent immediate complications during this procedure.   IMPRESSION:  1. Large colon polyps removed as described above.  2. Small sessile polyp removed by cold biopsy technique.   The observed polyps may well account for the patient's anemia.   PLAN:  Repeat colonoscopy in the next few months, hopefully with a more   complete prep, for reinspection and removal of any residual polyp tissue  that may not have sloughed in the interim.           ______________________________  Bernette Redbird, M.D.     RB/MEDQ  D:  07/19/2006  T:  07/21/2006  Job:  [id number]   cc:   [name], M.D.

## 2011-03-10 NOTE — Discharge Summary (Signed)
[place]  Patient:    [name], [name] Visit Number: [id number] MRN: [id number]          Service Type: MED Location: [phone] Attending Physician:  Judie Petit Dictated by:   Lorelle Formosa, M.D. Admit Date:  01/29/2002 Discharge Date: 02/06/2002                             Discharge Summary  ADMISSION DIAGNOSES: 1. Status asthma. 2. Chronic obstructive pulmonary disease.  DISCHARGE DIAGNOSES: 1. Status asthma. 2. Chronic obstructive pulmonary disease. 3. Bronchitis.  CONDITION ON DISCHARGE:  Stable and improved.  DISPOSITION:  Follow-up visit between one and two visits.  DISCHARGE MEDICATIONS: 1. Albuterol metered dose inhaler. 2. Atrovent metered dose inhaler. 3. Azmacort metered dose inhaler two puffs q.i.d. daily for wheezing. 4. Singulair 10 mg daily.  DISCHARGE INSTRUCTIONS:  No smoking.  HISTORY OF PRESENT ILLNESS:  This patient is a 70 year old married right-handed black female who presented to emergency room via EMS at approximately 4:20 a.m. with stable vital signs and shortness of breath which was worse.  She did not get relief with albuterol metered dose inhaler and Singulair 10 mg at home.  She became ill with coughing over the previous weekend and reached an exacerbation phase.  The patient had history of asthma and did not clear in the emergency room with multiple efforts.  Thus she was referred for inpatient treatment.  PAST MEDICAL HISTORY: FAMILY HISTORY: PERSONAL HISTORY: SOCIAL HISTORY: REVIEW OF SYSTEMS:  Outlined in the admission history and physical.  PHYSICAL EXAMINATION:  VITAL SIGNS: Blood pressure 151/62, pulse 130, respirations 20, temperature afebrile.  GENERAL: The patient was alert and oriented with 3 liters of nasal cannula O2 with saturations of 97 to 98%. HEENT: PERRLA.  EOMs were intact.  NECK:  Supple.  CHEST: Diffuse expiratory wheezing.  HEART: Regular rhythm and  rapid rate without significant murmur. ABDOMEN: Soft and bowel sounds were normal.  EXTREMITIES: No edema.  Dorsalis pedis pulses were equal and symmetrical.  NEUROLOGICAL: Grossly physiologic.  LABORATORY DATA:  Chest x-ray reveals COPD with no active lung disease initially and with no change in hyperaeration with repeat seven days later. CBC revealed white count of 11.9 with hemoglobin of 12.7, hematocrit 38.3, and platelets 210,000.  Repeat revealed white count down to 9.3 with hemoglobin of 10.9.  Arterial blood gas revealed pH of 7.48, pCO2 35.8, total CO2 28. Chemistries revealed potassium 3.4 with glucose in the 135 to 239 range. Urinalysis revealed moderate leukocyte esterase with few epithelial cells and few bacteria.  Sputum cultures revealed abundant hemophilus influenza betalactimase positive and normal flora.  AFB was negative.  Nasal flora for influenza A was negative, also negative for influenza B.  HOSPITAL COURSE:  The patient was admitted to the hospital and continued on albuterol nebulizer 2.5 mg with Atrovent 0.5 mg q.4h.  She was given Solu-Medrol 125 mg IV q.8h., Singulair 10 mg p.o. daily, and Biaxin 5 mg p.o. b.i.d.  Sputum cultures were done and were unremarkable.  She was also provided a peak flow meter with instructions.  Her Solu-Medrol was gradually reduced and she was transitioned to metered dose inhaler medicines.  The patient had hyperglycemia which was thought to be due to the steroids and this will be monitored on an outpatient basis. Dictated by:   Lorelle Formosa, M.D. Attending Physician:  Judie Petit DD:  03/20/02 TD:  03/21/02 Job: [id number]

## 2011-03-10 NOTE — Discharge Summary (Signed)
[name], [name]              ACCOUNT NO.:  [id number]   MEDICAL RECORD NO.:  [id number]          PATIENT TYPE:  INP   LOCATION:  4707                         FACILITY:  [facility]   PHYSICIAN:  Michaelyn Barter, M.D. DATE OF BIRTH:  Nov 17, 1940   DATE OF ADMISSION:  07/14/2006  DATE OF DISCHARGE:  07/22/2006                                 DISCHARGE SUMMARY   PRIMARY CARE PHYSICIAN:  Lorelle Formosa, M.D.   FINAL DIAGNOSES:  1. Acute chronic obstructive pulmonary disease exacerbation.  2. Anemia/acute blood loss anemia secondary to multiple colonic polyps.  3. Hypotension.  4. Protein-calorie malnutrition.  5. Hypokalemia.  6. Hypoglycemia.  7. Tubular adenoma involving the proximal colon.  8. Iron-deficiency anemia.   PROCEDURES:  1. Portable chest x-ray dated on July 14, 2006 x2.  2. Portable x-ray  completed July 15, 2006.  3. Esophagogastroduodenoscopy completed July 18, 2006.  4. Colonoscopy completed on July 19, 2006.   CONSULTATIONS:  Gastroenterology with Bernette Redbird, M.D.   HISTORY OF PRESENT ILLNESS:  [name] is a 70 year old female who  arrived with a chief complaint of shortness of breath.  She stated that  approximately a week prior to this admission she developed a cold.  She had  experienced a cough with some occasional production.  Her symptoms  progressed.  The day prior to this admission she felt congested and  developed shortness of breath.  She also began to wheeze.  Her albuterol  inhaler was used multiple times; however, only minimal relief was provided  by it.  On the morning of admission she woke up and felt short of breath.  She decided to come to the hospital for further evaluation.   PAST MEDICAL HISTORY:  Please see that dictated by Lonia Blood, MD.   HOSPITAL COURSE:  Problem 1.  ACUTE CHRONIC OBSTRUCTIVE PULMONARY DISEASE EXACERBATION:  At  the time of her admission it was believed that the patient may  have suffered  from a viral upper respiratory tract infection, which may have precipitated  her COPD exacerbation.  The patient was started on empiric antibiotics with  Avelox, breathing treatments were provided to the patient, and she was also  started on prednisone.  Her breathing, i.e., shortness of breath, improved  over the course of her hospitalization.  Over the last few days of her  hospitalization she never complained of any shortness of breath.  She  initially was treated with IV Solu-Medrol and this was later switched over  to p.o. prednisone.  Again, her symptoms associated with her COPD  exacerbation improved during the course of this hospitalization.   Problem 2.  ACUTE BLOOD LOSS ANEMIA:  The patient's hemoglobin was noted to  have been 10.3 on September 24.  GI was consulted for evaluation.  Dr.  Matthias Hughs was the physician who evaluated the patient.  On September 26, Dr.  Matthias Hughs performed an EGD.  His final impression was that the patient had a  tiny hiatal hernia with trace esophageal Candida, minimal gastritis,  bulbitis was noted.  Otherwise, the EGD appeared to be normal.  Given  the  fact that there was no obvious blood loss seen during this procedure, and  actually it was Petra Kuba, MD, who performed the EGD, on September 27,  Dr. Matthias Hughs performed a colonoscopy.  Dr. Ewing Schlein performed the EGD.  During  the colonoscopy a large colon polyp as well as a small sessile polyp was  identified.  The one polyp was described as pedunculated, 4-5 cm in size,  located in the cecum on a generous stalk.  Two large pieces measuring  approximately 2 cm in diameter were removed, as well as were some smaller  fragments.  There still remained approximately a 1.5 cm chunk of residual  polyp on the back side of the stalk, and this was not able to be removed.  There was a second rather large pedunculated polyp measuring approximately 3  cm in diameter at approximately 22 cm from the  external anal opening, which  was able to be removed.  The specimen was sent off to surgical pathology and  was found to be positive for tubular adenoma with no high-grade dysplasia or  malignancy identified as seen in the proximal colon.  Biopsy specimen from  the cecum revealed fragments of tubulovillous adenoma with no high-grade  dysplasia or malignancy identified.  It was believed that the presence of  the polyps was the source of the patient's bleeding.  The patient appeared  to have tolerated the procedure well.  Her hemoglobin did drift slightly  down following the procedure to a low of 9.3 by July 21, 2006.  However, it appeared to have rebounded to 10.8 and as of today, 9.7.  therefore, no blood transfusion was provided to the patient.  The patient  also had iron studies completed during the course of this hospitalization,  the results of which found the patient to be iron-deficient with an iron  level of 24, a TIBC of 367, and a percent saturation of 7.  She was started  on ferrous sulfate 325 mg p.o. b.i.d.   Problem 3.  HYPOTENSION:  The patient's blood pressure remained normotensive  to hypotensive throughout the course of her hospitalization.  She was  otherwise asymptomatic.  She received IV fluid supplementation secondary to  this.   Problem 4.  PROTEIN-CALORIE MALNUTRITION:  The patient appears to be  somewhat cachectic and also openly admits that she has a poor p.o. intake.  Her prealbumin level was noted to be 12.8 on July 18, 2006.  The  patient was encouraged to consume her meals, and this is an issue that may  need to be followed up once she is discharged from the hospital.   Problem 5.  HYPOKALEMIA:  The patient was noted to have had a potassium  level of 3.3 on July 19, 2006.  She received supplementation for this.  Likewise, the potassium on September 29 was 3.1.  She was also supplemented  with potassium.  Problem 6.  HYPOGLYCEMIA:  The  patient was noted on September 27 to have had  a hypoglycemic episode whereby her sugar dropped to 54.  This was attributed  to the fact that the patient was made n.p.o. in preparation for the EGD that  would be done the next day.  The patient was given a Resource drink and  appeared to be fine otherwise.   Problem 7.  QUESTIONABLE HISTORY OF ALCOHOL ABUSE:  The patient never  manifested any signs or symptoms of alcohol withdrawal.  She was provided  with folic acid  1 mg, multivitamin and thiamine throughout the course of her  hospitalization.  She showed no other obvious signs of alcohol withdrawal.   With regard to the possible Candida that was fund on the patient's EGD,  Nystatin was provided to the patient throughout the course of her  hospitalization.  The patient's condition at the time of discharge appears  to be stable.  Today, September 30, the patient currently states that she  feels better.  She denies having any shortness of breath.  She denies having  any bright red blood present in her stools, and she states that she wants to  go home today.   VITAL SIGNS:  Today her temperature is 98.3, heart rate 100, respirations  16, blood pressure 103/64, and she is saturating 96% on room air.   LABORATORY DATA:  Her hemoglobin is 9.7, hematocrit 29.7, white blood cell  count 6.6, platelets 236.  Sodium 140, potassium 3.5, chloride 104, CO2 28,  BUN 6, creatinine 0.6, glucose 87, calcium 8.6.   The decision has been made to discharge the patient from the hospital.  The  patient will be discharged home on the following medications:   1. Sterapred Double Strength prednisone tapering dosepak, 60 mg to 10 mg      p.o. daily.  2. Protonix 40 mg p.o. daily.  3. Thiamine 100 mg one tablet p.o. daily  4. Albuterol 2.5 mg two puffs q.6h.  5. Ferrous sulfate 325 mg p.o. b.i.d.  6. Folic acid 1 mg p.o. daily.  7. Mucinex 600 mg one tablet p.o. b.i.d.  8. Atrovent 0.5 mg via MDI q.6h.  9.  Avelox 400 mg p.o. daily.   The patient will be instructed to follow up with her primary care physician,  Dr. Billee Cashing, within the next one to two weeks for a recheck of her  hemoglobin.  She would also be told to follow up with Dr. Matthias Hughs of [facility] on Wednesday, October 24, at 9:45 a.m.  His number is [phone].  She was told to call Dr. Matthias Hughs day or night if weakness, dizziness,  or rectal bleed occur within the next 2 weeks, also to avoid aspirin,  Motrin, Advil, Aleve for at least 2 weeks.      Michaelyn Barter, M.D.  Electronically Signed     OR/MEDQ  D:  07/22/2006  T:  07/23/2006  Job:  [id number]

## 2011-03-10 NOTE — Consult Note (Signed)
[name], [name]              ACCOUNT NO.:  [id number]   MEDICAL RECORD NO.:  [id number]          PATIENT TYPE:  INP   LOCATION:  4707                         FACILITY:  [facility]   PHYSICIAN:  Petra Kuba, M.D.    DATE OF BIRTH:  06-Apr-1941   DATE OF CONSULTATION:  07/17/2006  DATE OF DISCHARGE:                                   CONSULTATION   HISTORY:  Patient seen at the request of the [facility] Team for iron  deficiency and weight loss.  She has a long history of COPD, really does not  have much GI complaints.  According to her family, does not eat very much.  According to her, she is not a vegetarian, moves her bowels normal, one time  has seen blood in the past just bright red, did not think much about it.  Does not have any urinary complaints of blood in her urine.  Really does not  complain of much swallowing problems or upper tract symptoms when she eats.  Her family says she drinks more than she admits to.   PAST MEDICAL HISTORY:  Her past medical history is pertinent for COPD with  some bronchitis.   PAST SURGICAL HISTORY:  Only surgery has been a tubal ligation.   FAMILY HISTORY:  Is pertinent for no colon cancers, ulcers or other obvious  GI problems.   CURRENT MEDICATIONS:  Include Lovenox, Protonix, Ventolin, Atrovent,  guaifenesin, thiamine, folic acid, Solu-Medrol, prednisone, moxifloxacin,  insulin, Maalox, Zofran, Ventolin, guaifenesin, Tylenol, Oxycodone.   SOCIAL HISTORY:  Does drink and smoke.  She has continued to smoke.  She  will use Advil about one time a month, no extra aspirin product.   REVIEW OF SYSTEMS:  Negative, except above.  Her breathing is much better  than when she came in to the hospital, although below her best that she gets  at home when she will leave the house and do her usual chores.   ALLERGIES:  NONE.   PHYSICAL EXAMINATION:  GENERAL:  In no acute distress.  VITAL SIGNS:  Stable, afebrile.  LUNGS:  Obviously wheezing  expiratory without any distress.  HEART:  Decreased heart sounds.  ABDOMEN:  Is soft and nontender.  No guaiac on the chart.   LABS:  Pertinent for a normal BUN and creatinine.  Hemoglobin of 10.3 and an  MCV of 76, platelet count 206.  Albumin 3.4.  LFTs all normal.  Prealbumin  is low as well.  TSH low normal.  Prealbumin of 12.8.  Her iron studies were  pertinent for iron deficiency, although the ferritin is very low normal.   ASSESSMENT:  1. Chronic obstructive pulmonary disease.  2. Tobacco and alcohol abuse.  3. Weight loss.  4. Iron deficiency anemia and the patient well overdue for colonic      screening.   PLAN:  The risks, benefits and methods of colonoscopy and endoscopy were  discussed.  We have elected to proceed with an endoscopy tomorrow.  Pending  those findings, would either do a CT or a colonoscopy next and we will go  ahead and  proceed late morning.  Hopefully, her lungs will be good enough to  take a quick look and then we can decide how to proceed.  I have explained  it all to Ms. Brannock and she is in agreement.           ______________________________  Petra Kuba, M.D.     MEM/MEDQ  D:  07/17/2006  T:  07/17/2006  Job:  [id number]   cc:   [facility]

## 2011-03-10 NOTE — Discharge Summary (Signed)
[name], [name]              ACCOUNT NO.:  [id number]   MEDICAL RECORD NO.:  [id number]           PATIENT TYPE:   LOCATION:                               FACILITY:  [facility]   PHYSICIAN:  Leonides Grills, M.D.          DATE OF BIRTH:   DATE OF ADMISSION:  09/06/2006  DATE OF DISCHARGE:                               DISCHARGE SUMMARY   HISTORY OF PRESENT ILLNESS:  [name] is a 70 year old female that  was admitted through the emergency department on September 06, 2006, by  Dr. Malon Kindle with a right hip fracture.  We took over care on  November 16.  The patient states that she was walking and she tripped  and fell.  She has no history of osteoporosis.   PAST MEDICAL HISTORY:  1. Asthma.  2. Chronic anemia.   HOSPITAL COURSE:  The patient was taken to the operating room on  November 18.  In the operating room she received IM nailing of a right  femur fracture.  During her hospital stay, was one of progressive  recovery.  She was seen by physical therapy and also internal medicine.  She did have several bouts with her asthma for which she received  albuterol inhalers.  The patient desired to be discharged home, but  physical therapy and occupational therapy were concerned about her  ambulation, so it was decided that she would be discharged for a brief  stay in a skilled nursing facility, where she will undergo progressive  physical therapy.  During her hospital stay she was found to be slightly  anemic with initial hemoglobin of 8.2 and hematocrit of 24.  She  received one unit of packed red blood cells after her hip surgery.   DISCHARGE DIAGNOSES:  1. Right hip fracture.  2. Asthma/chronic obstructive coronary disease.  3. Chronic anemia.   DISCHARGE PLAN:  The patient will be discharged to a skilled nursing  facility on September 14, 2006.  As far as the plan for her hip fracture,  she will get Vicodin 5 mg q.4-6 p.r.n. pain  She will also have DVT  prophylaxis with  Coumadin 2 mg daily for six weeks.  The skilled nursing  facility will monitor her INR.  She will also receive physical therapy  to strengthen.  She can bear weight as tolerated with assistance.  She  will be seen back for follow-up in our office in two  weeks' time.  She is to keep her dressing clean, dry, and intact and  sutures will be removed on her office visit.   For asthma/COPD, she is to use albuterol inhalers q.4-6 p.r.n.   Anemia.  She received blood transfusion while in the hospital.  She is  continued on her iron sulfate tablets.      Evlyn Kanner, P.A.      Leonides Grills, M.D.  Electronically Signed    RA/MEDQ  D:  09/12/2006  T:  09/12/2006  Job:  [id number]

## 2011-03-14 NOTE — Discharge Summary (Signed)
NAMEANAIJAH, AUGSBURGER              ACCOUNT NO.:  [id number]  MEDICAL RECORD NO.:  [id number]           PATIENT TYPE:  I  LOCATION:  1325                         FACILITY:  [facility]  PHYSICIAN:  Casimiro Needle B. Sherene Sires, MD, FCCPDATE OF BIRTH:  1941-06-19  DATE OF ADMISSION:  03/02/2011 DATE OF DISCHARGE:  03/09/2011                              DISCHARGE SUMMARY   FINAL DIAGNOSES: 1. Chronic obstructive pulmonary disease. 2. Resolved acute respiratory failure secondary to acute exacerbation     of chronic obstructive pulmonary disease. 3. Delirium. 4. Alcohol abuse. 5. Debilitation.  LABORATORY DATA:  On Mar 08, 2011, sodium 136, chloride 91, CO2 of 39, glucose 84, BUN 5, creatinine less than 0.47.  Urine culture on Mar 02, 2011, negative.  Free T3 was 1.4, free T4 was 1.2, TSH was 0.243.  PROCEDURE:  Endotracheal tube placed on May 10th, removed on May 11th.  BRIEF HISTORY:  This is a 70 year old female patient with known history of heavy alcohol abuse, as well as active smoking.  She has a history of severe chronic obstructive pulmonary disease and medical noncompliance. She presented on Mar 02, 2011, with approximately 1-week history of progressive dyspnea, change in sputum color from clear to green, and decreased appetite.  She presented with hypercarbic respiratory failure, she was initially interacting, however, her mental status deteriorated, requiring intubation and mechanical ventilation.  Because of this, the pulmonary service was asked to admit.  HOSPITAL COURSE BY DISCHARGE DIAGNOSES:  1.  Resolved acute respiratory failure, in the setting of acute exacerbation of chronic obstructive pulmonary disease.  Ms. Bogucki was intubated for hypercarbic respiratory failure.  Therapeutic interventions included systemic steroids, scheduled bronchodilators, supplemental oxygen, and initially mechanical ventilation with sedation and empiric antibiotics.  A family conference was had,  the patient's power of attorney who is her daughter was adamant and she wished to be removed from mechanical ventilator the following day based on the patient's previous wishes.  Because of this, she was extubated with no intention to reintubate, her code status was changed from full code to DNR status. At that point, it was expected to manage her symptoms only.  Ms. Segel actually improved following extubation and gradually improved clinically with conservative medical management including systemic steroids, bronchodilators, supplemental oxygen, and antibiotics.  Upon time of discharge, Ms. Owensby has now been weaned down to 2 L nasal cannula and completed a full course of Avelox for antibiotics. She is currently on a prednisone taper with plans to taper to off over the next 10 days. From a pulmonary standpoint, she is well compensated at this point. Her activity is rapidly improving and she is being maintained on a regular inhaler regimen.  She will be discharged to a skilled nursing facility where at this point her focus will be rehabilitation. 2. Delirium.  Ms. Woodroof has an active history of smoking and heavy alcohol abuse.  She was sedated while on the mechanical ventilator, initially was hypercarbic, but has since slowly improved.  She had some mild delirium, confused, specifically to time and place, however, has been cooperative with staff.  Because of her alcohol history, we have  added thiamine and folate to her medical regimen, she has initially required p.r.n. Haldol, but we have since added low dose Xanax, in the setting of her heavy alcohol history.  She seems to have responded to this nicely, it is unclear how much of this is chronic in the history of chronic EtOH abuse.  Suspect she is not a candidate for self-care at this point and she certainly will require assistance either to some degree indefinitely. 3.  Debilitation, following prolonged critical illness.  For  this, she     will require physical therapy and occupational therapy. 4. Sick euthyroid.  For this, she has not required supplementation and     clinically has improved.  DISCHARGE INSTRUCTIONS:  Diet, as tolerated.  DISCHARGE MEDICATIONS: 1. Brovana 15 mcg inhaled b.i.d. 2. Budesonide 0.5 mg inhaled b.i.d. 3. Folic acid 1 mg daily. 4. Nicotine patch 14 mg every 24 hours. 5. guaifenesin 600mg  LA 2 tabs po b.i.d 6. Thiamine 100 mg p.o. daily. 7. Ventolin 2.5 mg inhaled q.4 h. p.r.n. 8. Xanax 0.25 mg q.8 h. p.r.n. 9. prednisone 10 mg tab taper: 3 tabs daily for three days, 2 tabs daily for three days, then 1 tab po daily for 3 days then stop. 10.pepcid 20 mg po daily 11.Spiriva cap inhaled daily.  12.albuterol 2.5mg  NEB inhaled 4 times a day as needed.   DISPOSITION:  Ms. Coventry has met maximum benefit from inpatient stay. She is now medically cleared for discharge to skilled nuring facility.  Thirty minutes time dedicated to discharge assessment and plan.     Zenia Resides, NP   ______________________________ Charlaine Dalton. Sherene Sires, MD, FCCP    PB/MEDQ  D:  03/08/2011  T:  03/09/2011  Job:  [id number]  Electronically Signed by Zenia Resides NP on 03/14/2011 12:54:06 PM Electronically Signed by Sandrea Hughs MD FCCP on 03/14/2011 07:39:06 PM

## 2011-03-28 NOTE — Discharge Summary (Signed)
  NAMEJAMESON, MORROW              ACCOUNT NO.:  [id number]  MEDICAL RECORD NO.:  [id number]           PATIENT TYPE:  I  LOCATION:  1325                         FACILITY[facility]  PHYSICIAN:  Zenia Resides, NP      DATE OF BIRTH:  February 04, 1941  DATE OF ADMISSION:  03/02/2011 DATE OF DISCHARGE:  03/14/2011                              DISCHARGE SUMMARY   ADDENDUM:  Please see completed and updated discharge summary dated on the 15th. No additional comments are required.  Discharge medications have been reviewed and are up-to-date, no significant changes have been noted in her plan of care.  [name] is now stable and ready for discharge to skilled nursing facility.  She can follow up with Dr. Concepcion Elk in the outpatient setting who is her primary care provider.     Zenia Resides, NP     PB/MEDQ  D:  03/14/2011  T:  03/15/2011  Job:  [id number]  Electronically Signed by Zenia Resides NP on 03/27/2011 03:30:40 PM Electronically Signed by Sandrea Hughs MD FCCP on 03/28/2011 08:50:45 PM

## 2011-07-20 LAB — COMPREHENSIVE METABOLIC PANEL
ALT: 10
AST: 17
AST: 17
Alkaline Phosphatase: 60
Alkaline Phosphatase: 79
CO2: 28
CO2: 33 — ABNORMAL HIGH
Calcium: 9.5
Chloride: 104
Creatinine, Ser: 0.49
GFR calc Af Amer: >60
GFR calc Af Amer: >60
GFR calc non Af Amer: >60
GFR calc non Af Amer: >60
Glucose, Bld: 109 — ABNORMAL HIGH
Potassium: 2.8 — ABNORMAL LOW
Sodium: 129 — ABNORMAL LOW
Total Bilirubin: 0.8

## 2011-07-20 LAB — CULTURE, RESPIRATORY W GRAM STAIN

## 2011-07-20 LAB — BASIC METABOLIC PANEL
BUN: 2 — ABNORMAL LOW
BUN: 3 — ABNORMAL LOW
BUN: 4 — ABNORMAL LOW
CO2: 27
CO2: 39 — ABNORMAL HIGH
CO2: 39 — ABNORMAL HIGH
Calcium: 8.9
Chloride: 100
Chloride: 100
Chloride: 101
Chloride: 104
Creatinine, Ser: 0.48
Creatinine, Ser: 0.49
GFR calc non Af Amer: >60
GFR calc non Af Amer: >60
Glucose, Bld: 211 — ABNORMAL HIGH
Glucose, Bld: 93
Glucose, Bld: 94
Potassium: 3.7
Potassium: 4.1
Potassium: 4.1
Potassium: 4.2
Potassium: 4.8
Sodium: 134 — ABNORMAL LOW
Sodium: 139
Sodium: 139

## 2011-07-20 LAB — CBC
HCT: 30.8 — ABNORMAL LOW
HCT: 30.9 — ABNORMAL LOW
HCT: 31.9 — ABNORMAL LOW
HCT: 32.3 — ABNORMAL LOW
HCT: 32.5 — ABNORMAL LOW
HCT: 32.5 — ABNORMAL LOW
HCT: 34.4 — ABNORMAL LOW
Hemoglobin: 10.2 — ABNORMAL LOW
Hemoglobin: 10.3 — ABNORMAL LOW
Hemoglobin: 13.4
Hemoglobin: 9.8 — ABNORMAL LOW
Hemoglobin: 9.8 — ABNORMAL LOW
MCHC: 31.5
MCHC: 31.5
MCHC: 31.7
MCHC: 31.8
MCHC: 31.8
MCHC: 32.8
MCV: 77.4 — ABNORMAL LOW
MCV: 78.8
MCV: 78.8
MCV: 79.1
MCV: 79.8
MCV: 79.9
Platelets: 188
Platelets: 208
Platelets: 237
Platelets: 252
Platelets: 299
Platelets: 325
RBC: 4.06
RBC: 4.12
RBC: 5.26 — ABNORMAL HIGH
RDW: 15
RDW: 15.2
RDW: 15.4
RDW: 15.5
RDW: 15.6 — ABNORMAL HIGH
RDW: 16.1 — ABNORMAL HIGH
WBC: 4.9
WBC: 5.3
WBC: 5.5
WBC: 6.8
WBC: 8.8

## 2011-07-20 LAB — DIFFERENTIAL
Basophils Relative: 0
Eosinophils Absolute: 0
Eosinophils Relative: 0
Lymphs Abs: 0.7
Monocytes Relative: 15 — ABNORMAL HIGH

## 2011-07-20 LAB — CULTURE, BLOOD (ROUTINE X 2)

## 2011-07-20 LAB — PROTIME-INR
INR: 1
Prothrombin Time: 12.7
Prothrombin Time: 15.5 — ABNORMAL HIGH

## 2011-07-20 LAB — CK TOTAL AND CKMB (NOT AT ARMC)
CK, MB: 1.5
Relative Index: INVALID
Total CK: 24
Total CK: 29

## 2011-07-20 LAB — URINALYSIS, ROUTINE W REFLEX MICROSCOPIC
Glucose, UA: NEGATIVE
Hgb urine dipstick: NEGATIVE
Ketones, ur: 15 — AB
Protein, ur: NEGATIVE
Urobilinogen, UA: 1

## 2011-07-20 LAB — BLOOD GAS, ARTERIAL
Drawn by: 129801
FIO2: 1
pCO2 arterial: 51 — ABNORMAL HIGH
pH, Arterial: 7.364
pO2, Arterial: 270 — ABNORMAL HIGH

## 2011-07-20 LAB — LIPID PANEL
Cholesterol: 127
Total CHOL/HDL Ratio: 1.7
VLDL: 13

## 2011-07-20 LAB — EXPECTORATED SPUTUM ASSESSMENT W GRAM STAIN, RFLX TO RESP C

## 2011-07-20 LAB — LIPASE, BLOOD: Lipase: 18

## 2011-07-20 LAB — FERRITIN: Ferritin: 290 (ref 10–291)

## 2011-07-20 LAB — IRON AND TIBC: UIBC: 134

## 2011-07-20 LAB — H1N1 SCREEN (PCR): H1N1 Virus Scrn: NOT DETECTED

## 2011-07-20 LAB — FOLATE RBC: RBC Folate: 598

## 2011-07-20 LAB — VITAMIN B12: Vitamin B-12: 1374 — ABNORMAL HIGH (ref 211–911)

## 2011-07-20 LAB — AMYLASE: Amylase: 19 — ABNORMAL LOW

## 2011-07-20 LAB — FOLATE: Folate: 15.7

## 2011-08-02 LAB — BASIC METABOLIC PANEL
BUN: 3 — ABNORMAL LOW
Calcium: 9.3
GFR calc non Af Amer: >60
Glucose, Bld: 163 — ABNORMAL HIGH
Potassium: 4.2

## 2011-08-02 LAB — POCT I-STAT 3, ART BLOOD GAS (G3+)
Acid-Base Excess: 3 — ABNORMAL HIGH
Bicarbonate: 27.7 — ABNORMAL HIGH
O2 Saturation: 89
Patient temperature: 37
pO2, Arterial: 56 — ABNORMAL LOW

## 2011-08-02 LAB — DIFFERENTIAL
Basophils Absolute: 0
Basophils Absolute: 0
Eosinophils Relative: 0
Eosinophils Relative: 1
Lymphocytes Relative: 15
Lymphocytes Relative: 5 — ABNORMAL LOW
Lymphs Abs: 0.5 — ABNORMAL LOW
Lymphs Abs: 1.2
Monocytes Absolute: 0.6
Neutro Abs: 6.1
Neutro Abs: 9.2 — ABNORMAL HIGH
Neutrophils Relative %: 93 — ABNORMAL HIGH

## 2011-08-02 LAB — URINALYSIS, ROUTINE W REFLEX MICROSCOPIC
Bilirubin Urine: NEGATIVE
Glucose, UA: NEGATIVE
Ketones, ur: NEGATIVE
Nitrite: NEGATIVE
Specific Gravity, Urine: 1.008
pH: 6

## 2011-08-02 LAB — URINE MICROSCOPIC-ADD ON

## 2011-08-02 LAB — CBC
Hemoglobin: 12.1
Platelets: 198
RBC: 4.91
RDW: 14.8 — ABNORMAL HIGH
WBC: 10
WBC: 8

## 2011-08-02 LAB — COMPREHENSIVE METABOLIC PANEL
AST: 19
Albumin: 3.7
CO2: 29
Chloride: 104
Glucose, Bld: 123 — ABNORMAL HIGH
Potassium: 3 — ABNORMAL LOW
Sodium: 138

## 2011-08-02 LAB — CULTURE, BLOOD (ROUTINE X 2): Culture: NO GROWTH

## 2011-08-02 LAB — TSH: TSH: 0.363

## 2011-08-04 LAB — BASIC METABOLIC PANEL
Calcium: 9.7
GFR calc Af Amer: >60
GFR calc non Af Amer: >60
Glucose, Bld: 97
Potassium: 3.8
Sodium: 138

## 2011-08-04 LAB — DIFFERENTIAL
Basophils Absolute: 0
Eosinophils Absolute: 0.1
Eosinophils Relative: 2
Lymphocytes Relative: 40
Neutrophils Relative %: 47

## 2011-08-04 LAB — CK TOTAL AND CKMB (NOT AT ARMC)
CK, MB: 2.3
Total CK: 53

## 2011-08-04 LAB — CBC
Hemoglobin: 13.2
RDW: 14.9 — ABNORMAL HIGH
WBC: 4.4

## 2011-08-04 LAB — TROPONIN I: Troponin I: 0.02

## 2011-08-04 LAB — B-NATRIURETIC PEPTIDE (CONVERTED LAB): Pro B Natriuretic peptide (BNP): <30

## 2011-10-03 ENCOUNTER — Encounter: Payer: Self-pay | Admitting: Emergency Medicine

## 2011-10-03 ENCOUNTER — Emergency Department (HOSPITAL_COMMUNITY)
Admission: EM | Admit: 2011-10-03 | Discharge: 2011-10-04 | Disposition: A | Discharge status: Home / Self Care | Payer: Medicare Other | Attending: Emergency Medicine | Admitting: Emergency Medicine

## 2011-10-03 DIAGNOSIS — R0602 Shortness of breath: Secondary | ICD-10-CM | POA: Insufficient documentation

## 2011-10-03 DIAGNOSIS — J438 Other emphysema: Secondary | ICD-10-CM | POA: Insufficient documentation

## 2011-10-03 DIAGNOSIS — R413 Other amnesia: Secondary | ICD-10-CM | POA: Insufficient documentation

## 2011-10-03 DIAGNOSIS — R509 Fever, unspecified: Secondary | ICD-10-CM | POA: Insufficient documentation

## 2011-10-03 DIAGNOSIS — R05 Cough: Secondary | ICD-10-CM | POA: Insufficient documentation

## 2011-10-03 DIAGNOSIS — R059 Cough, unspecified: Secondary | ICD-10-CM | POA: Insufficient documentation

## 2011-10-03 DIAGNOSIS — N390 Urinary tract infection, site not specified: Secondary | ICD-10-CM | POA: Insufficient documentation

## 2011-10-03 HISTORY — DX: Unspecified dementia, unspecified severity, without behavioral disturbance, psychotic disturbance, mood disturbance, and anxiety: F03.90

## 2011-10-03 HISTORY — DX: Chronic obstructive pulmonary disease, unspecified: J44.9

## 2011-10-03 NOTE — ED Notes (Signed)
Pt was seen by her PCP today for her bronchitis and had a fever  Brought in tonight because pt has been vomiting off and on and has not been eating well for about a week

## 2011-10-04 ENCOUNTER — Emergency Department (HOSPITAL_COMMUNITY): Payer: Medicare Other

## 2011-10-04 ENCOUNTER — Other Ambulatory Visit: Payer: Self-pay

## 2011-10-04 LAB — URINALYSIS, ROUTINE W REFLEX MICROSCOPIC
Bilirubin Urine: NEGATIVE
Glucose, UA: NEGATIVE mg/dL
Hgb urine dipstick: NEGATIVE
Ketones, ur: 15 mg/dL — AB
Nitrite: NEGATIVE
Protein, ur: 30 mg/dL — AB
Specific Gravity, Urine: 1.012 (ref 1.005–1.030)
Urobilinogen, UA: 0.2 mg/dL (ref 0.0–1.0)
pH: 5.5 (ref 5.0–8.0)

## 2011-10-04 LAB — DIFFERENTIAL
Basophils Absolute: 0 10*3/uL (ref 0.0–0.1)
Basophils Relative: 1 % (ref 0–1)
Eosinophils Absolute: 0 10*3/uL (ref 0.0–0.7)
Eosinophils Relative: 1 % (ref 0–5)
Lymphocytes Relative: 34 % (ref 12–46)
Lymphs Abs: 1.1 10*3/uL (ref 0.7–4.0)
Monocytes Absolute: 0.6 10*3/uL (ref 0.1–1.0)
Monocytes Relative: 19 % — ABNORMAL HIGH (ref 3–12)
Neutro Abs: 1.4 10*3/uL — ABNORMAL LOW (ref 1.7–7.7)
Neutrophils Relative %: 45 % (ref 43–77)

## 2011-10-04 LAB — CBC
HCT: 36.2 % (ref 36.0–46.0)
Hemoglobin: 11.3 g/dL — ABNORMAL LOW (ref 12.0–15.0)
MCH: 23.3 pg — ABNORMAL LOW (ref 26.0–34.0)
MCHC: 31.2 g/dL (ref 30.0–36.0)
MCV: 74.8 fL — ABNORMAL LOW (ref 78.0–100.0)
Platelets: 173 10*3/uL (ref 150–400)
RBC: 4.84 MIL/uL (ref 3.87–5.11)
RDW: 14.5 % (ref 11.5–15.5)
WBC: 3.1 10*3/uL — ABNORMAL LOW (ref 4.0–10.5)

## 2011-10-04 LAB — BASIC METABOLIC PANEL
BUN: 7 mg/dL (ref 6–23)
CO2: 29 mEq/L (ref 19–32)
Calcium: 9.9 mg/dL (ref 8.4–10.5)
Chloride: 96 mEq/L (ref 96–112)
Creatinine, Ser: 0.74 mg/dL (ref 0.50–1.10)
GFR calc Af Amer: >90 mL/min (ref >90)
GFR calc non Af Amer: 85 mL/min — ABNORMAL LOW (ref >90)
Glucose, Bld: 80 mg/dL (ref 70–99)
Potassium: 3.4 mEq/L — ABNORMAL LOW (ref 3.5–5.1)
Sodium: 135 mEq/L (ref 135–145)

## 2011-10-04 LAB — URINE MICROSCOPIC-ADD ON

## 2011-10-04 LAB — TROPONIN I: Troponin I: <0.3 ng/mL (ref <0.30)

## 2011-10-04 NOTE — ED Notes (Signed)
Patient awake and daughter here [name] notified

## 2011-10-04 NOTE — ED Notes (Signed)
Patient sleeping soundly

## 2011-10-04 NOTE — ED Notes (Addendum)
Patient sts that her daughter has gone to work Psychologist, forensic offices and will be back later.  [name] in to see patient . Patient has a fair amt of dementia an dis unable to tell why she is here.  Labs drawn and urine sent

## 2011-10-04 NOTE — ED Provider Notes (Signed)
History     CSN: [id number] Arrival date & time: 10/03/2011 10:28 PM   First MD Initiated Contact with Patient 10/04/11 0034      Chief Complaint  Patient presents with  . Emesis    A level 5 caveat applies due to dementia. Patient is a 70 y.o. female presenting with cough.  Cough This is a recurrent problem. The current episode started more than 1 week ago. The problem occurs every few minutes. The problem has been gradually worsening. The cough is productive of sputum. The maximum temperature recorded prior to her arrival was 100 to 100.9 F. Associated symptoms include chills and wheezing. Pertinent negatives include no chest pain, no ear congestion, no ear pain, no rhinorrhea and no sore throat.  :During initial interview daughter is not present so hx obtained from pt who is unreliable historian and reports she's "been sick" for over a week. RN was able to ascertain from daughter that pt was seen at PCP office yesterday for persistent cough and was placed on Avelox for Bronchitis. States she has been SOB for 2 to 3 months but pt noted on home 02 and hx COPD.   Past Medical History  Diagnosis Date  . COPD (chronic obstructive pulmonary disease)   . Dementia     Past Surgical History  Procedure Date  . Hip surgery     Family History  Problem Relation Age of Onset  . COPD Other     History  Substance Use Topics  . Smoking status: Former Games developer  . Smokeless tobacco: Not on file  . Alcohol Use: No    OB History    Grav Para Term Preterm Abortions TAB SAB Ect Mult Living                  Review of Systems  Constitutional: Positive for chills.  HENT: Negative.  Negative for ear pain, sore throat and rhinorrhea.   Eyes: Negative.   Respiratory: Positive for cough and wheezing.   Cardiovascular: Negative.  Negative for chest pain.  Gastrointestinal: Negative.   Genitourinary: Negative.   Musculoskeletal: Negative.   Skin: Negative.   Neurological: Negative.     Hematological: Negative.   Psychiatric/Behavioral: Negative.     Allergies  Review of patient's allergies indicates no known allergies.  Home Medications   Current Outpatient Rx  Name Route Sig Dispense Refill  . ALBUTEROL SULFATE (2.5 MG/3ML) 0.083% IN NEBU Nebulization Take 2.5 mg by nebulization every 6 (six) hours as needed.      . ALPRAZOLAM 0.25 MG PO TABS Oral Take 0.25 mg by mouth at bedtime as needed.      . ARFORMOTEROL TARTRATE 15 MCG/2ML IN NEBU Nebulization Take 15 mcg by nebulization 2 (two) times daily.      . BUDESONIDE 0.5 MG/2ML IN SUSP Nebulization Take 0.5 mg by nebulization 2 (two) times daily.      Marland Kitchen FOLIC ACID 1 MG PO TABS Oral Take 1 mg by mouth daily.      Marland Kitchen MOXIFLOXACIN HCL 400 MG PO TABS Oral Take 400 mg by mouth daily.      Marland Kitchen PSEUDOEPH-HYDROCODONE 60-5 MG/5ML PO SOLN Oral Take 5 mLs by mouth 2 (two) times daily.      Marland Kitchen VITAMIN B-1 100 MG PO TABS Oral Take 100 mg by mouth daily.      Marland Kitchen TIOTROPIUM BROMIDE MONOHYDRATE 18 MCG IN CAPS Inhalation Place 18 mcg into inhaler and inhale daily.  BP 93/58  Pulse 108  Temp(Src) 99.3 F (37.4 C) (Oral)  Resp 18  SpO2 100%  Physical Exam  Constitutional: She is oriented to person, place, and time. She appears well-developed and well-nourished.  HENT:  Head: Normocephalic and atraumatic.  Eyes: Conjunctivae are normal.  Neck: Neck supple.  Cardiovascular: Normal rate and regular rhythm.   Pulmonary/Chest: Effort normal and breath sounds normal.  Abdominal: Soft. Bowel sounds are normal.  Musculoskeletal: Normal range of motion.  Neurological: She is alert and oriented to person, place, and time.  Skin: Skin is warm and dry. No erythema.  Psychiatric: She has a normal mood and affect. Her speech is normal and behavior is normal. Cognition and memory are impaired.    ED Course  Procedures   Date: 10/04/2011  Rate: 109  Rhythm: sinus tachycardia  QRS Axis: normal  Intervals: normal  ST/T Wave  abnormalities: normal  Conduction Disutrbances:none  Narrative Interpretation: (R) atrial abnormality  Old EKG Reviewed:  Daughter return to ED at time of d/c. Clarifies that she brought her mother in because she thought she was behaving more confused and lethargic than usual. I have discussed findings and impression w/ daughter and pt. Pt is already on Avelox for 10 days. Cosulted w/ pharmacy who states Avelox is now believed to be appropriate for UTI coverage. Will plan for d/c home, encourage them to continue Avelox and arrange close f/u w/ Dr Concepcion Elk to have her urine rechecked in the next several days to assure infection is clearing. Pt and daughter are agreeable w/ plan. Labs Reviewed  CBC - Abnormal; Notable for the following:    WBC 3.1 (*)    Hemoglobin 11.3 (*)    MCV 74.8 (*)    MCH 23.3 (*)    All other components within normal limits  DIFFERENTIAL - Abnormal; Notable for the following:    Monocytes Relative 19 (*)    Neutro Abs 1.4 (*)    All other components within normal limits  BASIC METABOLIC PANEL - Abnormal; Notable for the following:    Potassium 3.4 (*)    GFR calc non Af Amer 85 (*)    All other components within normal limits  URINALYSIS, ROUTINE W REFLEX MICROSCOPIC - Abnormal; Notable for the following:    APPearance CLOUDY (*)    Ketones, ur 15 (*)    Protein, ur 30 (*)    Leukocytes, UA MODERATE (*)    All other components within normal limits  URINE MICROSCOPIC-ADD ON - Abnormal; Notable for the following:    Bacteria, UA MANY (*)    All other components within normal limits  TROPONIN I   Dg Chest 2 View  10/04/2011  *RADIOLOGY REPORT*  Clinical Data: Fever, cough, shortness of breath  CHEST - 2 VIEW  Comparison: 03/04/2011  Findings: Prominent emphysematous changes and scattered fibrosis in the lungs.  Normal heart size and pulmonary vascularity.  No focal airspace consolidation.  No blunting of costophrenic angles.  No obvious pneumothorax.  Degenerative  changes in the spine. Scattered calcified granulomas in the lungs.  No significant change since previous study.  IMPRESSION: Marked emphysema.  No evidence of active pulmonary disease.  Original Report Authenticated By: Marlon Pel, M.D.     No diagnosis found.    MDM  HPI, PE and clinical findings support symptoms associated w/ UTI in elderly pt,        Leanne Chang, NP 10/09/11 2257

## 2011-10-10 NOTE — ED Provider Notes (Signed)
Medical screening examination/treatment/procedure(s) were performed by non-physician practitioner and as supervising physician I was immediately available for consultation/collaboration.  Cyndra Numbers, MD 10/10/11 [phone]

## 2012-01-18 ENCOUNTER — Other Ambulatory Visit: Payer: Self-pay | Admitting: Internal Medicine

## 2012-01-18 DIAGNOSIS — Z1231 Encounter for screening mammogram for malignant neoplasm of breast: Secondary | ICD-10-CM

## 2012-02-16 ENCOUNTER — Ambulatory Visit: Payer: Medicare Other

## 2012-02-28 ENCOUNTER — Ambulatory Visit
Admission: RE | Admit: 2012-02-28 | Discharge: 2012-02-28 | Disposition: A | Discharge status: Home / Self Care | Payer: Medicare Other | Source: Ambulatory Visit | Attending: Internal Medicine | Admitting: Internal Medicine

## 2012-02-28 DIAGNOSIS — Z1231 Encounter for screening mammogram for malignant neoplasm of breast: Secondary | ICD-10-CM

## 2012-03-01 ENCOUNTER — Ambulatory Visit (INDEPENDENT_AMBULATORY_CARE_PROVIDER_SITE_OTHER)
Admission: RE | Admit: 2012-03-01 | Discharge: 2012-03-01 | Disposition: A | Discharge status: Home / Self Care | Payer: Medicare Other | Source: Ambulatory Visit | Attending: Critical Care Medicine | Admitting: Critical Care Medicine

## 2012-03-01 ENCOUNTER — Encounter: Payer: Self-pay | Admitting: Critical Care Medicine

## 2012-03-01 ENCOUNTER — Ambulatory Visit (INDEPENDENT_AMBULATORY_CARE_PROVIDER_SITE_OTHER): Payer: Medicare Other | Admitting: Critical Care Medicine

## 2012-03-01 VITALS — BP 94/60 | HR 89 | Temp 98.1°F | Ht 64.0 in | Wt 95.8 lb

## 2012-03-01 DIAGNOSIS — F1011 Alcohol abuse, in remission: Secondary | ICD-10-CM | POA: Insufficient documentation

## 2012-03-01 DIAGNOSIS — J449 Chronic obstructive pulmonary disease, unspecified: Secondary | ICD-10-CM

## 2012-03-01 DIAGNOSIS — F1027 Alcohol dependence with alcohol-induced persisting dementia: Secondary | ICD-10-CM | POA: Insufficient documentation

## 2012-03-01 MED ORDER — PREDNISONE 10 MG PO TABS: ORAL_TABLET | ORAL | Status: DC

## 2012-03-01 NOTE — Progress Notes (Signed)
Subjective:    Patient ID: [name], female    DOB: 30-Sep-1941, 71 y.o.   MRN: [id number]  HPI Comments: Chronic dyspnea.   Dx Copd 1990s.   Now on oxygen 42yrs.    Shortness of Breath This is a chronic problem. The current episode started more than 1 year ago. The problem occurs constantly. The problem has been unchanged. Associated symptoms include abdominal pain, leg swelling, orthopnea, PND and wheezing. Pertinent negatives include no chest pain, ear pain, fever, headaches, hemoptysis, leg pain, neck pain, rash, rhinorrhea, sore throat, sputum production or vomiting. The symptoms are aggravated by URIs, weather changes, fumes, odors and any activity. Risk factors include smoking. She has tried steroid inhalers and beta agonist inhalers for the symptoms. The treatment provided moderate relief. Her past medical history is significant for COPD, DVT, PE and pneumonia. There is no history of allergies, aspirin allergies, asthma, bronchiolitis, CAD, chronic lung disease, a heart failure or a recent surgery. (PNA past winter in hospital)   Past Medical History  Diagnosis Date  . COPD (chronic obstructive pulmonary disease)   . 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 H/O ETOH abuse      Family History  Problem Relation Age of Onset  . Emphysema Sister     smoker  . Asthma Daughter   . Heart disease Father   . Deep vein thrombosis Daughter      History   Social History  . Marital Status: Married    Spouse Name: N/A    Number of Children: 6  . Years of Education: N/A   Occupational History  . Unemployed    Social History Main Topics  . Smoking status: Former Smoker -- 0.5 packs/day for 50 years    Types: Cigarettes    Quit date: 02/21/2011  . Smokeless tobacco: Never Used  . Alcohol Use: No  . Drug Use: No  . Sexually Active: Not on file   Other Topics Concern  . Not on file   Social History Narrative  . No narrative on file     No Known Allergies   Outpatient Prescriptions  Prior to Visit  Medication Sig Dispense Refill  . albuterol (PROVENTIL) (2.5 MG/3ML) 0.083% nebulizer solution Take 2.5 mg by nebulization every 6 (six) hours as needed.        . ALPRAZolam (XANAX) 0.25 MG tablet Take 0.25 mg by mouth at bedtime as needed.        Marland Kitchen arformoterol (BROVANA) 15 MCG/2ML NEBU Take 15 mcg by nebulization 2 (two) times daily.        . budesonide (PULMICORT) 0.5 MG/2ML nebulizer solution Take 0.5 mg by nebulization 2 (two) times daily.        . folic acid (FOLVITE) 1 MG tablet Take 1 mg by mouth daily.        Marland Kitchen thiamine (VITAMIN B-1) 100 MG tablet Take 100 mg by mouth daily.        Marland Kitchen tiotropium (SPIRIVA) 18 MCG inhalation capsule Place 18 mcg into inhaler and inhale daily.        Marland Kitchen moxifloxacin (AVELOX) 400 MG tablet Take 400 mg by mouth daily.        . Pseudoeph-Hydrocodone (REZIRA) 60-5 MG/5ML SOLN Take 5 mLs by mouth 2 (two) times daily.             Review of Systems  Constitutional: Positive for appetite change and unexpected weight change. Negative for fever and chills.  HENT: Positive for congestion and  dental problem. Negative for ear pain, nosebleeds, sore throat, rhinorrhea, sneezing, trouble swallowing, neck pain, voice change, postnasal drip and sinus pressure.   Eyes: Negative for visual disturbance.  Respiratory: Positive for cough, shortness of breath and wheezing. Negative for hemoptysis, sputum production and choking.   Cardiovascular: Positive for orthopnea, leg swelling and PND. Negative for chest pain.  Gastrointestinal: Positive for abdominal pain. Negative for vomiting and diarrhea.  Genitourinary: Negative for difficulty urinating.  Musculoskeletal: Positive for arthralgias.  Skin: Negative for rash.  Neurological: Negative for tremors, syncope and headaches.  Hematological: Does not bruise/bleed easily.       Objective:   Physical Exam  Filed Vitals:   03/01/12 1404  BP: 94/60  Pulse: 89  Temp: 98.1 F (36.7 C)  TempSrc: Oral    Height: 5\' 4"  (1.626 m)  Weight: 95 lb 12.8 oz (43.455 kg)  SpO2: 99%    Gen: Thin African American female in no distress  ENT: No lesions,  mouth clear,  oropharynx clear, no postnasal drip  Neck: No JVD, no TMG, no carotid bruits  Lungs: No use of accessory muscles, no dullness to percussion, distant breath sounds  Cardiovascular: RRR, heart sounds normal, no murmur or gallops, no peripheral edema  Abdomen: soft and NT, no HSM,  BS normal  Musculoskeletal: No deformities, no cyanosis or clubbing  Neuro: alert, non focal  Skin: Warm, no lesions or rashes  Dg Chest 2 View  03/01/2012  *RADIOLOGY REPORT*  Clinical Data: Coughing. COPD.  Shortness of breath.  Ex-smoker. Asthma.  CHEST - 2 VIEW  Comparison: Multiple previous studies including 10/04/2011 and 11/02/2010.09/30/2009.  Findings: Cardiac silhouette is small.  There is prominence of the main pulmonary artery segment.  There is ectasia of thoracic aorta. Mediastinal and hilar contours appear stable.  Prominence of right hilum is felt to be vascular.  There is severe generalized hyperinflation configuration consistent with COPD.  No pulmonary edema, pneumonia, or pleural effusion is seen.  On the PA image nodular density projects over the lower midlung area consistent with nipple density. This appearance was also seen on previous study of 09/30/2009.  No pulmonary infiltrates are evident.  No pleural effusions are seen.  There is osteopenic appearance of the bones.  There is degenerative spondylosis.  IMPRESSION: Severe hyperinflation consistent with COPD.  No acute superimposed abnormality is evident.  Stable chronic findings are detailed above.  Original Report Authenticated By: Crawford Givens, M.D.         Assessment & Plan:   COPD (chronic obstructive pulmonary disease) Gold stage D. COPD oxygen dependent FEV1 19% predicted Plan Brief prednisone pulse Continue nebulized therapy as prescribed Continue Spiriva Obtain chest  x-ray   note chest x-ray results above  Updated Medication List Outpatient Encounter Prescriptions as of 03/01/2012  Medication Sig Dispense Refill  . albuterol (PROVENTIL) (2.5 MG/3ML) 0.083% nebulizer solution Take 2.5 mg by nebulization every 6 (six) hours as needed.        . ALPRAZolam (XANAX) 0.25 MG tablet Take 0.25 mg by mouth at bedtime as needed.        Marland Kitchen arformoterol (BROVANA) 15 MCG/2ML NEBU Take 15 mcg by nebulization 2 (two) times daily.        . budesonide (PULMICORT) 0.5 MG/2ML nebulizer solution Take 0.5 mg by nebulization 2 (two) times daily.        . folic acid (FOLVITE) 1 MG tablet Take 1 mg by mouth daily.        Marland Kitchen thiamine (VITAMIN  B-1) 100 MG tablet Take 100 mg by mouth daily.        Marland Kitchen tiotropium (SPIRIVA) 18 MCG inhalation capsule Place 18 mcg into inhaler and inhale daily.        . predniSONE (DELTASONE) 10 MG tablet Take 4 for two days three for two days two for two days one for two days  20 tablet  0  . DISCONTD: moxifloxacin (AVELOX) 400 MG tablet Take 400 mg by mouth daily.        Marland Kitchen DISCONTD: Pseudoeph-Hydrocodone (REZIRA) 60-5 MG/5ML SOLN Take 5 mLs by mouth 2 (two) times daily.

## 2012-03-01 NOTE — Patient Instructions (Signed)
Obtain a chest xray Start prednisone 10mg  Take 4 for two days three for two days two for two days one for two days Stay on nebulizer and oxygen Stay on spiriva Return 3 months

## 2012-03-01 NOTE — Assessment & Plan Note (Signed)
Gold stage D. COPD oxygen dependent FEV1 19% predicted Plan Brief prednisone pulse Continue nebulized therapy as prescribed Continue Spiriva Obtain chest x-ray

## 2012-03-01 NOTE — Progress Notes (Signed)
Quick Note:  Notify the patient that the Xray is stable and no pneumonia, It does show COPD  No change in medications are recommended. Continue current meds as prescribed at last office visit ______

## 2012-03-04 NOTE — Progress Notes (Signed)
Quick Note:  Called, spoke with pt's daughter, Babette Relic. I informed her of Xray results and recs per Dr. Delford Field. She verbalized understanding of this and voiced no further questions/concerns at this time. ______

## 2012-07-02 ENCOUNTER — Encounter: Payer: Self-pay | Admitting: Critical Care Medicine

## 2012-07-02 ENCOUNTER — Ambulatory Visit (INDEPENDENT_AMBULATORY_CARE_PROVIDER_SITE_OTHER): Payer: Medicare Other | Admitting: Critical Care Medicine

## 2012-07-02 VITALS — BP 122/60 | HR 76 | Temp 98.5°F | Ht 64.0 in | Wt 97.6 lb

## 2012-07-02 DIAGNOSIS — J449 Chronic obstructive pulmonary disease, unspecified: Secondary | ICD-10-CM

## 2012-07-02 DIAGNOSIS — Z23 Encounter for immunization: Secondary | ICD-10-CM

## 2012-07-02 NOTE — Patient Instructions (Addendum)
No change in medications. Return in          6 months Flu vaccine was given today 07/02/2012

## 2012-07-02 NOTE — Assessment & Plan Note (Signed)
Gold D Copd stable at present Plan No change in inhaled or maintenance medications. Return in  6 months Flu vaccine today

## 2012-07-02 NOTE — Progress Notes (Signed)
Subjective:    Patient ID: [name], female    DOB: 05/26/1941, 71 y.o.   MRN: [id number]  HPI 07/02/2012 Not seen since first OV 5/13. Golds D Copd. Pt unchanged from 5/13 OV.  Pt still is dyspneic with exertion.  No real chest pain.  Notes mucus and is green.  Not wearing oxygen all the time.   Past Medical History  Diagnosis Date  . COPD (chronic obstructive pulmonary disease)   . 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 H/O ETOH abuse      Family History  Problem Relation Age of Onset  . Emphysema Sister     smoker  . Asthma Daughter   . Heart disease Father   . Deep vein thrombosis Daughter      History   Social History  . Marital Status: Married    Spouse Name: N/A    Number of Children: 6  . Years of Education: N/A   Occupational History  . Unemployed    Social History Main Topics  . Smoking status: Former Smoker -- 0.5 packs/day for 50 years    Types: Cigarettes    Quit date: 02/21/2011  . Smokeless tobacco: Never Used  . Alcohol Use: No  . Drug Use: No  . Sexually Active: Not on file   Other Topics Concern  . Not on file   Social History Narrative  . No narrative on file     No Known Allergies   Outpatient Prescriptions Prior to Visit  Medication Sig Dispense Refill  . albuterol (PROVENTIL) (2.5 MG/3ML) 0.083% nebulizer solution Take 2.5 mg by nebulization every 6 (six) hours as needed.        . ALPRAZolam (XANAX) 0.25 MG tablet Take 0.25 mg by mouth at bedtime as needed.        Marland Kitchen arformoterol (BROVANA) 15 MCG/2ML NEBU Take 15 mcg by nebulization 2 (two) times daily.        . budesonide (PULMICORT) 0.5 MG/2ML nebulizer solution Take 0.5 mg by nebulization 2 (two) times daily.        . folic acid (FOLVITE) 1 MG tablet Take 1 mg by mouth daily.        Marland Kitchen thiamine (VITAMIN B-1) 100 MG tablet Take 100 mg by mouth daily.        Marland Kitchen tiotropium (SPIRIVA) 18 MCG inhalation capsule Place 18 mcg into inhaler and inhale daily.        . predniSONE (DELTASONE) 10 MG  tablet Take 4 for two days three for two days two for two days one for two days  20 tablet  0       Review of Systems  Constitutional: Positive for appetite change and unexpected weight change. Negative for chills.  HENT: Positive for congestion and dental problem. Negative for nosebleeds, sneezing, trouble swallowing, voice change, postnasal drip and sinus pressure.   Eyes: Negative for visual disturbance.  Respiratory: Positive for cough. Negative for choking.   Gastrointestinal: Negative for diarrhea.  Genitourinary: Negative for difficulty urinating.  Musculoskeletal: Positive for arthralgias.  Neurological: Negative for tremors and syncope.  Hematological: Does not bruise/bleed easily.       Objective:   Physical Exam   Filed Vitals:   07/02/12 1102  BP: 122/60  Pulse: 76  Temp: 98.5 F (36.9 C)  TempSrc: Oral  Height: 5\' 4"  (1.626 m)  Weight: 44.271 kg (97 lb 9.6 oz)  SpO2: 96%    Gen: Thin African American female in no distress  ENT: No lesions,  mouth clear,  oropharynx clear, no postnasal drip  Neck: No JVD, no TMG, no carotid bruits  Lungs: No use of accessory muscles, no dullness to percussion, distant breath sounds  Cardiovascular: RRR, heart sounds normal, no murmur or gallops, no peripheral edema  Abdomen: soft and NT, no HSM,  BS normal  Musculoskeletal: No deformities, no cyanosis or clubbing  Neuro: alert, non focal  Skin: Warm, no lesions or rashes         Assessment & Plan:   COPD (chronic obstructive pulmonary disease) Gold D Copd stable at present Plan No change in inhaled or maintenance medications. Return in  6 months Flu vaccine today    Updated Medication List Outpatient Encounter Prescriptions as of 07/02/2012  Medication Sig Dispense Refill  . albuterol (PROVENTIL) (2.5 MG/3ML) 0.083% nebulizer solution Take 2.5 mg by nebulization every 6 (six) hours as needed.        . ALPRAZolam (XANAX) 0.25 MG tablet Take 0.25 mg by  mouth at bedtime as needed.        Marland Kitchen arformoterol (BROVANA) 15 MCG/2ML NEBU Take 15 mcg by nebulization 2 (two) times daily.        . benzonatate (TESSALON) 100 MG capsule Take 100-200 mg by mouth as needed.      . budesonide (PULMICORT) 0.5 MG/2ML nebulizer solution Take 0.5 mg by nebulization 2 (two) times daily.        . cetirizine (ZYRTEC) 10 MG tablet Take 10 mg by mouth daily.      Marland Kitchen donepezil (ARICEPT) 5 MG tablet Take 5 mg by mouth daily.      . famotidine (PEPCID) 20 MG tablet Take 20 mg by mouth daily.      . folic acid (FOLVITE) 1 MG tablet Take 1 mg by mouth daily.        Marland Kitchen thiamine (VITAMIN B-1) 100 MG tablet Take 100 mg by mouth daily.        Marland Kitchen tiotropium (SPIRIVA) 18 MCG inhalation capsule Place 18 mcg into inhaler and inhale daily.        Marland Kitchen DISCONTD: predniSONE (DELTASONE) 10 MG tablet Take 4 for two days three for two days two for two days one for two days  20 tablet  0

## 2012-12-13 ENCOUNTER — Other Ambulatory Visit: Payer: Self-pay | Admitting: Ophthalmology

## 2012-12-27 ENCOUNTER — Encounter (HOSPITAL_COMMUNITY): Payer: Self-pay | Admitting: *Deleted

## 2012-12-27 ENCOUNTER — Emergency Department (HOSPITAL_COMMUNITY)
Admission: EM | Admit: 2012-12-27 | Discharge: 2012-12-27 | Disposition: A | Discharge status: Home / Self Care | Payer: Medicare Other | Attending: Emergency Medicine | Admitting: Emergency Medicine

## 2012-12-27 DIAGNOSIS — J449 Chronic obstructive pulmonary disease, unspecified: Secondary | ICD-10-CM | POA: Insufficient documentation

## 2012-12-27 DIAGNOSIS — R0602 Shortness of breath: Secondary | ICD-10-CM | POA: Insufficient documentation

## 2012-12-27 DIAGNOSIS — Z87891 Personal history of nicotine dependence: Secondary | ICD-10-CM | POA: Insufficient documentation

## 2012-12-27 DIAGNOSIS — F1027 Alcohol dependence with alcohol-induced persisting dementia: Secondary | ICD-10-CM | POA: Insufficient documentation

## 2012-12-27 DIAGNOSIS — Z79899 Other long term (current) drug therapy: Secondary | ICD-10-CM | POA: Insufficient documentation

## 2012-12-27 DIAGNOSIS — J4489 Other specified chronic obstructive pulmonary disease: Secondary | ICD-10-CM | POA: Insufficient documentation

## 2012-12-27 DIAGNOSIS — Z9981 Dependence on supplemental oxygen: Secondary | ICD-10-CM | POA: Insufficient documentation

## 2012-12-27 DIAGNOSIS — F1010 Alcohol abuse, uncomplicated: Secondary | ICD-10-CM | POA: Insufficient documentation

## 2012-12-27 NOTE — ED Notes (Signed)
[facility] delivered oxygen tank to bedside. Will deliver more O2 tanks to pt's house.

## 2012-12-27 NOTE — ED Notes (Signed)
Lunch tray ordered for pt. Spoke with PJ, case manager in regards to home oxygen supply.
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
## 2012-12-27 NOTE — ED Notes (Signed)
[facility] contacted to transport pt back home. 
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
## 2012-12-27 NOTE — ED Provider Notes (Signed)
Medical screening examination/treatment/procedure(s) were conducted as a shared visit with non-physician practitioner(s) and myself.  I personally evaluated the patient during the encounter  Please see my separate respective documentation pertaining to this patient encounter   Vida Roller, MD 12/27/12 2010

## 2012-12-27 NOTE — ED Notes (Signed)
[facility] arrived.  
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
## 2012-12-27 NOTE — ED Notes (Signed)
Family at bedside. 

## 2012-12-30 ENCOUNTER — Encounter (HOSPITAL_COMMUNITY): Payer: Self-pay | Admitting: Pharmacist
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
## 2013-01-03 ENCOUNTER — Encounter (HOSPITAL_COMMUNITY)
Admission: RE | Admit: 2013-01-03 | Discharge: 2013-01-03 | Disposition: A | Discharge status: Home / Self Care | Payer: Medicare Other | Source: Ambulatory Visit | Attending: Ophthalmology | Admitting: Ophthalmology

## 2013-01-03 ENCOUNTER — Encounter (HOSPITAL_COMMUNITY): Payer: Self-pay

## 2013-01-03 HISTORY — DX: Dependence on supplemental oxygen: Z99.81

## 2013-01-03 HISTORY — DX: Personal history of other venous thrombosis and embolism: Z86.718

## 2013-01-03 HISTORY — DX: Gastro-esophageal reflux disease without esophagitis: K21.9

## 2013-01-03 HISTORY — DX: Other specified disorders of bone density and structure, unspecified site: M85.80

## 2013-01-03 HISTORY — DX: Unspecified osteoarthritis, unspecified site: M19.90

## 2013-01-03 HISTORY — DX: Unspecified cataract: H26.9

## 2013-01-03 LAB — BASIC METABOLIC PANEL
BUN: 9 mg/dL (ref 6–23)
Chloride: 101 mEq/L (ref 96–112)
Creatinine, Ser: 0.77 mg/dL (ref 0.50–1.10)
GFR calc non Af Amer: 83 mL/min — ABNORMAL LOW (ref >90)
Glucose, Bld: 93 mg/dL (ref 70–99)
Potassium: 4 mEq/L (ref 3.5–5.1)

## 2013-01-03 LAB — CBC
HCT: 33.9 % — ABNORMAL LOW (ref 36.0–46.0)
Hemoglobin: 10.9 g/dL — ABNORMAL LOW (ref 12.0–15.0)
MCH: 23.3 pg — ABNORMAL LOW (ref 26.0–34.0)
MCHC: 32.2 g/dL (ref 30.0–36.0)

## 2013-01-03 NOTE — Pre-Procedure Instructions (Signed)
Britten Parady Zeringue  01/03/2013   Your procedure is scheduled on:  Wednesday, March 19  Report to [facility] at 0630 AM.  Call this number if you have problems the morning of surgery: [phone]   Remember:   Do not eat food or drink liquids after midnight.Tuesday night   Take these medicines the morning of surgery with A SIP OF WATER: inhalers and Nebulizer treatment,Spiriva,Pepcid   Do not wear jewelry, make-up or nail polish.  Do not wear lotions, powders,  Perfumes or deodorant.  Do not shave 48 hours prior to surgery.   Do not bring valuables to the hospital.  Contacts, dentures or bridgework may not be worn into surgery.      Patients discharged the day of surgery will not be allowed to drive  home.  Name and phone number of your driver:     Special Instructions: Shower using CHG 2 nights before surgery and the night before surgery.  If you shower the day of surgery use CHG.  Use special wash - you have one bottle of CHG for all showers.  You should use approximately 1/3 of the bottle for each shower.   Please read over the following fact sheets that you were given: Pain Booklet, Coughing and Deep Breathing and Surgical Site Infection Prevention

## 2013-01-08 ENCOUNTER — Ambulatory Visit (HOSPITAL_COMMUNITY): Payer: Medicare Other | Admitting: Anesthesiology

## 2013-01-08 ENCOUNTER — Encounter (HOSPITAL_COMMUNITY): Payer: Self-pay | Admitting: *Deleted

## 2013-01-08 ENCOUNTER — Encounter (HOSPITAL_COMMUNITY)
Admission: RE | Disposition: A | Discharge status: Home / Self Care | Payer: Self-pay | Source: Ambulatory Visit | Attending: Ophthalmology

## 2013-01-08 ENCOUNTER — Encounter (HOSPITAL_COMMUNITY): Payer: Self-pay | Admitting: Anesthesiology

## 2013-01-08 ENCOUNTER — Ambulatory Visit (HOSPITAL_COMMUNITY)
Admission: RE | Admit: 2013-01-08 | Discharge: 2013-01-08 | Disposition: A | Discharge status: Home / Self Care | Payer: Medicare Other | Source: Ambulatory Visit | Attending: Ophthalmology | Admitting: Ophthalmology

## 2013-01-08 DIAGNOSIS — H2589 Other age-related cataract: Secondary | ICD-10-CM | POA: Insufficient documentation

## 2013-01-08 DIAGNOSIS — M899 Disorder of bone, unspecified: Secondary | ICD-10-CM | POA: Insufficient documentation

## 2013-01-08 DIAGNOSIS — F0390 Unspecified dementia without behavioral disturbance: Secondary | ICD-10-CM | POA: Insufficient documentation

## 2013-01-08 DIAGNOSIS — M129 Arthropathy, unspecified: Secondary | ICD-10-CM | POA: Insufficient documentation

## 2013-01-08 DIAGNOSIS — Z79899 Other long term (current) drug therapy: Secondary | ICD-10-CM | POA: Insufficient documentation

## 2013-01-08 DIAGNOSIS — Z9981 Dependence on supplemental oxygen: Secondary | ICD-10-CM | POA: Insufficient documentation

## 2013-01-08 DIAGNOSIS — IMO0002 Reserved for concepts with insufficient information to code with codable children: Secondary | ICD-10-CM | POA: Insufficient documentation

## 2013-01-08 DIAGNOSIS — J449 Chronic obstructive pulmonary disease, unspecified: Secondary | ICD-10-CM | POA: Insufficient documentation

## 2013-01-08 DIAGNOSIS — Z86718 Personal history of other venous thrombosis and embolism: Secondary | ICD-10-CM | POA: Insufficient documentation

## 2013-01-08 DIAGNOSIS — Z87891 Personal history of nicotine dependence: Secondary | ICD-10-CM | POA: Insufficient documentation

## 2013-01-08 DIAGNOSIS — J4489 Other specified chronic obstructive pulmonary disease: Secondary | ICD-10-CM | POA: Insufficient documentation

## 2013-01-08 DIAGNOSIS — K219 Gastro-esophageal reflux disease without esophagitis: Secondary | ICD-10-CM | POA: Insufficient documentation

## 2013-01-08 HISTORY — PX: CATARACT EXTRACTION W/PHACO: SHX586

## 2013-01-08 SURGERY — PHACOEMULSIFICATION, CATARACT, WITH IOL INSERTION
Anesthesia: General | Site: Eye | Laterality: Right | Wound class: Clean

## 2013-01-08 MED ORDER — LIDOCAINE HCL 2 % IJ SOLN
INTRAMUSCULAR | Status: DC | PRN
  Administered 2013-01-08: 20 mL

## 2013-01-08 MED ORDER — GLYCOPYRROLATE 0.2 MG/ML IJ SOLN
INTRAMUSCULAR | Status: DC | PRN
  Administered 2013-01-08: .1 mg via INTRAVENOUS
  Administered 2013-01-08: .4 mg via INTRAVENOUS

## 2013-01-08 MED ORDER — ACETAZOLAMIDE SODIUM 500 MG IJ SOLR
INTRAMUSCULAR | Status: AC
  Filled 2013-01-08: qty 500

## 2013-01-08 MED ORDER — LIDOCAINE HCL 4 % MT SOLN
OROMUCOSAL | Status: DC | PRN
  Administered 2013-01-08: 4 mL via TOPICAL

## 2013-01-08 MED ORDER — WATER FOR IRRIGATION, STERILE IR SOLN
Status: DC | PRN
  Administered 2013-01-08: 1000 mL

## 2013-01-08 MED ORDER — LIDOCAINE HCL 2 % IJ SOLN
INTRAMUSCULAR | Status: AC
  Filled 2013-01-08: qty 20

## 2013-01-08 MED ORDER — EPINEPHRINE HCL 1 MG/ML IJ SOLN
INTRAOCULAR | Status: DC | PRN
  Administered 2013-01-08: 09:00:00

## 2013-01-08 MED ORDER — TETRACAINE HCL 0.5 % OP SOLN
OPHTHALMIC | Status: DC | PRN
  Administered 2013-01-08: 2 [drp] via OPHTHALMIC

## 2013-01-08 MED ORDER — SODIUM CHLORIDE 0.9 % IR SOLN
Status: DC | PRN
  Administered 2013-01-08: 1

## 2013-01-08 MED ORDER — FENTANYL CITRATE 0.05 MG/ML IJ SOLN
INTRAMUSCULAR | Status: DC | PRN
  Administered 2013-01-08: 50 ug via INTRAVENOUS

## 2013-01-08 MED ORDER — BUPIVACAINE HCL (PF) 0.75 % IJ SOLN
INTRAMUSCULAR | Status: AC
  Filled 2013-01-08: qty 10

## 2013-01-08 MED ORDER — LIDOCAINE HCL (CARDIAC) 20 MG/ML IV SOLN
INTRAVENOUS | Status: DC | PRN
  Administered 2013-01-08: 20 mg via INTRAVENOUS

## 2013-01-08 MED ORDER — ONDANSETRON HCL 4 MG/2ML IJ SOLN: 4.0000 mg | Freq: Once | INTRAMUSCULAR | Status: DC | PRN

## 2013-01-08 MED ORDER — NA CHONDROIT SULF-NA HYALURON 40-30 MG/ML IO SOLN
INTRAOCULAR | Status: DC | PRN
  Administered 2013-01-08 (×2): 0.5 mL via INTRAOCULAR

## 2013-01-08 MED ORDER — ONDANSETRON HCL 4 MG/2ML IJ SOLN
INTRAMUSCULAR | Status: DC | PRN
  Administered 2013-01-08: 4 mg via INTRAVENOUS

## 2013-01-08 MED ORDER — NEOSTIGMINE METHYLSULFATE 1 MG/ML IJ SOLN
INTRAMUSCULAR | Status: DC | PRN
  Administered 2013-01-08: 3 mg via INTRAVENOUS

## 2013-01-08 MED ORDER — TRIAMCINOLONE ACETONIDE 40 MG/ML IJ SUSP
INTRAMUSCULAR | Status: AC
  Filled 2013-01-08: qty 5

## 2013-01-08 MED ORDER — NA CHONDROIT SULF-NA HYALURON 40-30 MG/ML IO SOLN
INTRAOCULAR | Status: AC
  Filled 2013-01-08: qty 0.5

## 2013-01-08 MED ORDER — BUPIVACAINE HCL (PF) 0.75 % IJ SOLN
INTRAMUSCULAR | Status: DC | PRN
  Administered 2013-01-08: 10 mL

## 2013-01-08 MED ORDER — BACITRACIN-POLYMYXIN B 500-10000 UNIT/GM OP OINT
TOPICAL_OINTMENT | OPHTHALMIC | Status: AC
  Filled 2013-01-08: qty 3.5

## 2013-01-08 MED ORDER — PHENYLEPHRINE HCL 10 MG/ML IJ SOLN
INTRAMUSCULAR | Status: DC | PRN
  Administered 2013-01-08 (×4): 120 ug via INTRAVENOUS

## 2013-01-08 MED ORDER — LACTATED RINGERS IV SOLN
INTRAVENOUS | Status: DC | PRN
  Administered 2013-01-08: 09:00:00 via INTRAVENOUS

## 2013-01-08 MED ORDER — EPINEPHRINE HCL 1 MG/ML IJ SOLN
INTRAMUSCULAR | Status: AC
  Filled 2013-01-08: qty 1

## 2013-01-08 MED ORDER — PREDNISOLONE ACETATE 1 % OP SUSP
1.0000 [drp] | OPHTHALMIC | Status: AC
  Administered 2013-01-08: 1 [drp] via OPHTHALMIC
  Filled 2013-01-08: qty 5

## 2013-01-08 MED ORDER — DEXAMETHASONE SODIUM PHOSPHATE 10 MG/ML IJ SOLN
INTRAMUSCULAR | Status: AC
  Filled 2013-01-08: qty 1

## 2013-01-08 MED ORDER — HYPROMELLOSE (GONIOSCOPIC) 2.5 % OP SOLN
OPHTHALMIC | Status: DC | PRN
  Administered 2013-01-08: 1 [drp] via OPHTHALMIC

## 2013-01-08 MED ORDER — DEXAMETHASONE SODIUM PHOSPHATE 10 MG/ML IJ SOLN
INTRAMUSCULAR | Status: DC | PRN
  Administered 2013-01-08: 10 mg

## 2013-01-08 MED ORDER — HYPROMELLOSE (GONIOSCOPIC) 2.5 % OP SOLN
OPHTHALMIC | Status: AC
  Filled 2013-01-08: qty 15

## 2013-01-08 MED ORDER — GATIFLOXACIN 0.5 % OP SOLN
1.0000 [drp] | OPHTHALMIC | Status: AC | PRN
  Administered 2013-01-08 (×3): 1 [drp] via OPHTHALMIC
  Filled 2013-01-08 (×2): qty 2.5

## 2013-01-08 MED ORDER — BSS IO SOLN
INTRAOCULAR | Status: DC | PRN
  Administered 2013-01-08: 15 mL via INTRAOCULAR

## 2013-01-08 MED ORDER — PHENYLEPHRINE HCL 2.5 % OP SOLN
1.0000 [drp] | OPHTHALMIC | Status: AC | PRN
  Administered 2013-01-08 (×3): 1 [drp] via OPHTHALMIC
  Filled 2013-01-08: qty 3

## 2013-01-08 MED ORDER — TETRACAINE HCL 0.5 % OP SOLN
OPHTHALMIC | Status: AC
  Filled 2013-01-08: qty 2

## 2013-01-08 MED ORDER — ACETAMINOPHEN 10 MG/ML IV SOLN: 1000.0000 mg | Freq: Once | INTRAVENOUS | Status: DC | PRN

## 2013-01-08 MED ORDER — PROPOFOL 10 MG/ML IV BOLUS
INTRAVENOUS | Status: DC | PRN
  Administered 2013-01-08: 100 mg via INTRAVENOUS
  Administered 2013-01-08: 60 mg via INTRAVENOUS

## 2013-01-08 MED ORDER — PROVISC 10 MG/ML IO SOLN
INTRAOCULAR | Status: DC | PRN
  Administered 2013-01-08: .85 mL via INTRAOCULAR

## 2013-01-08 MED ORDER — BSS IO SOLN
INTRAOCULAR | Status: AC
  Filled 2013-01-08: qty 500

## 2013-01-08 MED ORDER — ROCURONIUM BROMIDE 100 MG/10ML IV SOLN
INTRAVENOUS | Status: DC | PRN
  Administered 2013-01-08: 20 mg via INTRAVENOUS

## 2013-01-08 MED ORDER — CEFAZOLIN SUBCONJUNCTIVAL INJECTION 100 MG/0.5 ML
200.0000 mg | INJECTION | SUBCONJUNCTIVAL | Status: AC
  Administered 2013-01-08: 200 mg via SUBCONJUNCTIVAL
  Filled 2013-01-08: qty 1

## 2013-01-08 MED ORDER — TETRACAINE HCL 0.5 % OP SOLN
2.0000 [drp] | OPHTHALMIC | Status: AC
  Administered 2013-01-08: 2 [drp] via OPHTHALMIC
  Filled 2013-01-08: qty 2

## 2013-01-08 MED ORDER — SODIUM HYALURONATE 10 MG/ML IO SOLN
INTRAOCULAR | Status: AC
  Filled 2013-01-08: qty 0.85

## 2013-01-08 MED ORDER — BACITRACIN-POLYMYXIN B 500-10000 UNIT/GM OP OINT
TOPICAL_OINTMENT | OPHTHALMIC | Status: DC | PRN
  Administered 2013-01-08: 1 via OPHTHALMIC

## 2013-01-08 SURGICAL SUPPLY — 61 items
APPLICATOR COTTON TIP 6IN STRL (MISCELLANEOUS) ×2 IMPLANT
APPLICATOR DR MATTHEWS STRL (MISCELLANEOUS) ×2 IMPLANT
BAG MINI COLL DRAIN (WOUND CARE) ×2 IMPLANT
BLADE EYE MINI 60D BEAVER (BLADE) IMPLANT
BLADE KERATOME 2.75 (BLADE) ×2 IMPLANT
BLADE STAB KNIFE 15DEG (BLADE) IMPLANT
CANNULA ANTERIOR CHAMBER 27GA (MISCELLANEOUS) IMPLANT
CLOTH BEACON ORANGE TIMEOUT ST (SAFETY) ×2 IMPLANT
DRAPE OPHTHALMIC 77X100 STRL (CUSTOM PROCEDURE TRAY) ×4 IMPLANT
DRAPE POUCH INSTRU U-SHP 10X18 (DRAPES) ×2 IMPLANT
DRSG TEGADERM 4X4.75 (GAUZE/BANDAGES/DRESSINGS) ×4 IMPLANT
FILTER BLUE MILLIPORE (MISCELLANEOUS) IMPLANT
GLOVE SS BIOGEL STRL SZ 6.5 (GLOVE) ×2 IMPLANT
GLOVE SUPERSENSE BIOGEL SZ 6.5 (GLOVE) ×2
GLOVE SURG SS PI 6.0 STRL IVOR (GLOVE) ×4 IMPLANT
GLOVE SURG SS PI 6.5 STRL IVOR (GLOVE) ×4 IMPLANT
GOWN SRG XL XLNG 56XLVL 4 (GOWN DISPOSABLE) ×2 IMPLANT
GOWN STRL NON-REIN LRG LVL3 (GOWN DISPOSABLE) ×4 IMPLANT
GOWN STRL NON-REIN XL XLG LVL4 (GOWN DISPOSABLE) ×2
KIT BASIN OR (CUSTOM PROCEDURE TRAY) ×2 IMPLANT
KIT ROOM TURNOVER OR (KITS) IMPLANT
KNIFE GRIESHABER SHARP 2.5MM (MISCELLANEOUS) ×2 IMPLANT
LENS IOL ACRYSOF MP POST 21.5 (Intraocular Lens) ×2 IMPLANT
MASK EYE SHIELD (GAUZE/BANDAGES/DRESSINGS) ×2 IMPLANT
NEEDLE 18GX1X1/2 (RX/OR ONLY) (NEEDLE) ×2 IMPLANT
NEEDLE 22X1 1/2 (OR ONLY) (NEEDLE) ×2 IMPLANT
NEEDLE 25GX 5/8IN NON SAFETY (NEEDLE) ×2 IMPLANT
NEEDLE FILTER BLUNT 18X 1/2SAF (NEEDLE) ×1
NEEDLE FILTER BLUNT 18X1 1/2 (NEEDLE) ×1 IMPLANT
NEEDLE HYPO 30X.5 LL (NEEDLE) ×4 IMPLANT
NS IRRIG 1000ML POUR BTL (IV SOLUTION) ×2 IMPLANT
PACK CATARACT CUSTOM (CUSTOM PROCEDURE TRAY) ×2 IMPLANT
PACK CATARACT MCHSCP (PACKS) ×2 IMPLANT
PACK COMBINED CATERACT/VIT 23G (OPHTHALMIC RELATED) IMPLANT
PAD ARMBOARD 7.5X6 YLW CONV (MISCELLANEOUS) ×4 IMPLANT
PAD EYE OVAL STERILE LF (GAUZE/BANDAGES/DRESSINGS) ×2 IMPLANT
PHACO TIP KELMAN 45DEG (TIP) IMPLANT
PROBE ANTERIOR 20G W/INFUS NDL (MISCELLANEOUS) IMPLANT
RING MALYGIN (MISCELLANEOUS) IMPLANT
ROLLS DENTAL (MISCELLANEOUS) IMPLANT
SHUTTLE MONARCH TYPE A (NEEDLE) ×2 IMPLANT
SOLUTION ANTI FOG 6CC (MISCELLANEOUS) IMPLANT
SOLUTION BETADINE 4OZ (MISCELLANEOUS) ×2 IMPLANT
SPEAR EYE SURG WECK-CEL (MISCELLANEOUS) ×2 IMPLANT
SUT ETHILON 10-0 CS-B-6CS-B-6 (SUTURE)
SUT ETHILON 5 0 P 3 18 (SUTURE)
SUT ETHILON 9 0 TG140 8 (SUTURE) IMPLANT
SUT NYLON ETHILON 5-0 P-3 1X18 (SUTURE) IMPLANT
SUT PLAIN 6 0 TG1408 (SUTURE) IMPLANT
SUT POLY NON ABSORB 10-0 8 STR (SUTURE) IMPLANT
SUT VICRYL 6 0 S 29 12 (SUTURE) IMPLANT
SUTURE EHLN 10-0 CS-B-6CS-B-6 (SUTURE) IMPLANT
SYR 20CC LL (SYRINGE) IMPLANT
SYR 5ML LL (SYRINGE) IMPLANT
SYR TB 1ML LUER SLIP (SYRINGE) ×2 IMPLANT
SYRINGE 10CC LL (SYRINGE) IMPLANT
TIP ABS 45DEG FLARED 0.9MM (TIP) ×2 IMPLANT
TOWEL OR 17X24 6PK STRL BLUE (TOWEL DISPOSABLE) ×4 IMPLANT
WATER STERILE IRR 1000ML POUR (IV SOLUTION) ×2 IMPLANT
WIPE INSTRUMENT ADHESIVE BACK (MISCELLANEOUS) ×4 IMPLANT
WIPE INSTRUMENT VISIWIPE 73X73 (MISCELLANEOUS) ×2 IMPLANT

## 2013-01-08 NOTE — Anesthesia Postprocedure Evaluation (Signed)
  Anesthesia Post-op Note  Patient: [name]  Procedure(s) Performed: Procedure(s) with comments: PHACOEMULSIFICATION, POSTERIOR CHAMBER INTRAOCULAR LENS PLACEMENT (Right) - ATTEMPTED LOCAL ANESTHESIA; PATIENT RE-PREPPED AND DRAPED FOR GENERAL ANESTHESIA  Patient Location: PACU  Anesthesia Type:General  Level of Consciousness: awake, alert  and oriented  Airway and Oxygen Therapy: Patient Spontanous Breathing and Patient connected to nasal cannula oxygen  Post-op Pain: none  Post-op Assessment: Post-op Vital signs reviewed, Patient's Cardiovascular Status Stable, Respiratory Function Stable, Patent Airway and Pain level controlled  Post-op Vital Signs: stable  Complications: No apparent anesthesia complications 

## 2013-01-08 NOTE — Transfer of Care (Signed)
Immediate Anesthesia Transfer of Care Note  Patient: [name]  Procedure(s) Performed: Procedure(s) with comments: PHACOEMULSIFICATION, POSTERIOR CHAMBER INTRAOCULAR LENS PLACEMENT (Right) - ATTEMPTED LOCAL ANESTHESIA; PATIENT RE-PREPPED AND DRAPED FOR GENERAL ANESTHESIA  Patient Location: PACU  Anesthesia Type:General  Level of Consciousness: awake, alert  and oriented  Airway & Oxygen Therapy: Patient Spontanous Breathing and Patient connected to nasal cannula oxygen  Post-op Assessment: Report given to PACU RN and Post -op Vital signs reviewed and stable  Post vital signs: Reviewed and stable  Complications: No apparent anesthesia complications

## 2013-01-08 NOTE — H&P (Signed)
Pre-operative History and Physical for Ophthalmic Surgery  [name] 01/08/2013                  Chief Complaint: Decreased vision right eye  Diagnosis:  Combined Cataract  No Known Allergies Prior to Admission medications   Medication Sig Start Date End Date Taking? Authorizing Provider  albuterol (PROVENTIL) (2.5 MG/3ML) 0.083% nebulizer solution Take 2.5 mg by nebulization every 6 (six) hours as needed for shortness of breath.    Yes Historical Provider, MD  ALPRAZolam (XANAX) 0.25 MG tablet Take 0.25 mg by mouth at bedtime.    Yes Historical Provider, MD  arformoterol (BROVANA) 15 MCG/2ML NEBU Take 15 mcg by nebulization 2 (two) times daily.     Yes Historical Provider, MD  b complex vitamins tablet Take 1 tablet by mouth daily.   Yes Historical Provider, MD  benzonatate (TESSALON) 100 MG capsule Take 100 mg by mouth 2 (two) times daily as needed for cough.   Yes Historical Provider, MD  budesonide (PULMICORT) 0.5 MG/2ML nebulizer solution Take 0.5 mg by nebulization 2 (two) times daily.    Yes Historical Provider, MD  cetirizine (ZYRTEC) 10 MG tablet Take 10 mg by mouth daily.   Yes Historical Provider, MD  famotidine (PEPCID) 20 MG tablet Take 20 mg by mouth daily.   Yes Historical Provider, MD  folic acid (FOLVITE) 1 MG tablet Take 1 mg by mouth daily.    Yes Historical Provider, MD  Polyethyl Glycol-Propyl Glycol (SYSTANE OP) Place 1 drop into both eyes daily as needed (for dry eyes).   Yes Historical Provider, MD  tiotropium (SPIRIVA) 18 MCG inhalation capsule Place 18 mcg into inhaler and inhale daily.    Yes Historical Provider, MD  Vitamin D, Ergocalciferol, (DRISDOL) 50000 UNITS CAPS Take 50,000 Units by mouth every 7 (seven) days. Takes on mondays   Yes Historical Provider, MD    Planned Procedure:                                       Phacoemulsification, Posterior Chamber Intra-ocular Lens OD  Filed Vitals:   01/08/13 0656  BP: 95/60  Pulse: 65  Temp: 98.5 F  (36.9 C)  Resp: 18    Past Medical History  Diagnosis Date  . COPD (chronic obstructive pulmonary disease)   . 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 H/O ETOH abuse   . Oxygen dependent     2l per nasal cannula  . GERD (gastroesophageal reflux disease)   . Osteopenia   . Arthritis   . Dementia   . Cataracts, bilateral   . Hx of blood clots     Past Surgical History  Procedure Laterality Date  . Hip surgery    . Fracture surgery       History   Social History  . Marital Status: Widowed    Spouse Name: N/A    Number of Children: 6  . Years of Education: N/A   Occupational History  . Unemployed    Social History Main Topics  . Smoking status: Former Smoker -- 0.50 packs/day for 50 years    Types: Cigarettes    Quit date: 02/21/2011  . Smokeless tobacco: Never Used  . Alcohol Use: No  . Drug Use: No  . Sexually Active: Not on file   Other Topics Concern  . Not on file   Social History Narrative  .  No narrative on file     The following examination is for anesthesia clearance for minimally invasive Ophthalmic surgery. It is primarily to document heart and lung findings and is not intended to elucidate unknown general medical conditions inclusive of abdominal masses, lung lesions, etc.   General Constitution:  within normal limits   Alertness/Orientation:  Person, time place     yes   HEENT:  Eye Findings:  Combined Cataract                   right eye  Neck: supple without masses  Chest/Lungs: clear to auscultation  Cardiac: Normal S1 and S2 without Murmur, S3 or S4  Neuro: non-focal  Impression:  Combined Cataract OD  Planned Procedure:   Phacoemulsification, Posterior Chamber Intraocular Lens OD    Shade Flood, MD

## 2013-01-08 NOTE — Progress Notes (Signed)
Dr. Clarisa Kindred was asked to call patient's daughter , number  was given.

## 2013-01-08 NOTE — Anesthesia Procedure Notes (Addendum)
Procedure Name: MAC Date/Time: 01/08/2013 10:32 AM Performed by: Marena Chancy Pre-anesthesia Checklist: Patient identified, Timeout performed, Emergency Drugs available, Suction available and Patient being monitored Patient Re-evaluated:Patient Re-evaluated prior to inductionOxygen Delivery Method: Nasal cannula   Procedure Name: Intubation Date/Time: 01/08/2013 10:53 AM Performed by: Marena Chancy Oxygen Delivery Method: Circle system utilized Preoxygenation: Pre-oxygenation with 100% oxygen Intubation Type: IV induction Ventilation: Mask ventilation without difficulty Laryngoscope Size: Miller and 2 Grade View: Grade I Tube type: Oral Tube size: 7.0 mm Number of attempts: 1 Placement Confirmation: ETT inserted through vocal cords under direct vision,  breath sounds checked- equal and bilateral and positive ETCO2 Secured at: 21 cm Tube secured with: Tape Dental Injury: Teeth and Oropharynx as per pre-operative assessment

## 2013-01-08 NOTE — Anesthesia Postprocedure Evaluation (Signed)
  Anesthesia Post-op Note  Patient: [name]  Procedure(s) Performed: Procedure(s) with comments: PHACOEMULSIFICATION, POSTERIOR CHAMBER INTRAOCULAR LENS PLACEMENT (Right) - ATTEMPTED LOCAL ANESTHESIA; PATIENT RE-PREPPED AND DRAPED FOR GENERAL ANESTHESIA  Patient Location: PACU  Anesthesia Type:General  Level of Consciousness: awake, alert  and oriented  Airway and Oxygen Therapy: Patient Spontanous Breathing and Patient connected to nasal cannula oxygen  Post-op Pain: none  Post-op Assessment: Post-op Vital signs reviewed, Patient's Cardiovascular Status Stable, Respiratory Function Stable, Patent Airway and Pain level controlled  Post-op Vital Signs: stable  Complications: No apparent anesthesia complications

## 2013-01-08 NOTE — Anesthesia Preprocedure Evaluation (Addendum)
Anesthesia Evaluation  Patient identified by MRN, date of birth, ID band Patient awake and Patient confused    Reviewed: Allergy & Precautions, H&P , NPO status , Patient's Chart, lab work & pertinent test results  Airway Mallampati: II      Dental  (+) Teeth Intact and Dental Advisory Given   Pulmonary    + decreased breath sounds      Cardiovascular Rhythm:Regular Rate:Normal     Neuro/Psych    GI/Hepatic   Endo/Other    Renal/GU      Musculoskeletal   Abdominal (+) + scaphoid   Peds  Hematology   Anesthesia Other Findings   Reproductive/Obstetrics                           Anesthesia Physical Anesthesia Plan  ASA: III  Anesthesia Plan: MAC   Post-op Pain Management:    Induction: Intravenous  Airway Management Planned: Natural Airway and Simple Face Mask  Additional Equipment:   Intra-op Plan:   Post-operative Plan:   Informed Consent: I have reviewed the patients History and Physical, chart, labs and discussed the procedure including the risks, benefits and alternatives for the proposed anesthesia with the patient or authorized representative who has indicated his/her understanding and acceptance.   Dental advisory given  Plan Discussed with: CRNA and Surgeon  Anesthesia Plan Comments: (Cataract R. Eye COPD H/O dementia per records answers questions appropriately  Plan MAC   Kipp Brood, MD)       Anesthesia Quick Evaluation

## 2013-01-08 NOTE — Preoperative (Signed)
Beta Blockers   Reason not to administer Beta Blockers:Not Applicable 

## 2013-01-08 NOTE — Op Note (Signed)
[name] 01/08/2013 Cataract: Combined, Nuclear  Procedure: Phacoemulsification, Posterior Chamber Intra-ocular Lens Operative Eye:  right eye  Surgeon: Shade Flood Estimated Blood Loss: minimal Specimens for Pathology:  None Complications: none  The patient was prepared and draped in the usual manner for ocular surgery on the right eye. A Cook lid speculum was placed. A peripheral clear corneal incision was made at the surgical limbus centered at the 11:00 meridian. A separate clear corneal stab incision was made with a 15 degree blade at the 2:00 meridian to permit bi-manual technique. Viscoat and  Provisc as an underlying layer next to the capsule was instilled into the anterior chamber through that incision.  A keratome was used to create a self sealing incision entering the anterior chamber at the 11:00 meridian. A capsulorhexis was performed using a bent 25g needle. The lens was hydrodissected and the nucleus was hydrodilineated using a Nichammin cannula. The Chang chopper was inserted and used to rotate the lens to insure adequate lens mobility. The phacoemulsification handpiece was inserted and a combined phaco-chop technique was employed, fracturing the lens into separate sections with subsequent removal with the phaco handpiece. She was noted to have loose zonules at 12:00. Viscoat was placed in the superior angle to prevent vitreous prolapse through the zonules.  The I/A cannula was used to remove remaining lens cortex. Provisc was instilled and used to deepen the anterior chamber and posterior capsule bag. The Monarch injector was used to place a folded Acrysof [id number] PC IOL, + 21.50  diopters, into the capsule bag. A Sinskey lens hook was used to dial in the trailing haptic.  The I/A cannula was used to remove the viscoelastic from the anterior chamber. BSS was used to bring IOP to the desired range and the wound was checked to insure it was watertight. Subconjunctival injections  of Ancef 100/0.12ml and Dexamethasone 0.5 ml of a 10mg /51ml solution were placed without complication. The lid speculum and drapes were removed and the patient's eye was patched with Polymixin/Bacitracin ophthalmic ointment. An eye shield was placed and the patient was transferred alert and conversant from the operating room to the post-operative recovery area.   Shade Flood, MD

## 2013-01-09 ENCOUNTER — Encounter (HOSPITAL_COMMUNITY): Payer: Self-pay | Admitting: Ophthalmology

## 2013-01-30 ENCOUNTER — Other Ambulatory Visit: Payer: Self-pay

## 2013-01-30 DIAGNOSIS — Z1231 Encounter for screening mammogram for malignant neoplasm of breast: Secondary | ICD-10-CM

## 2013-02-19 ENCOUNTER — Ambulatory Visit (INDEPENDENT_AMBULATORY_CARE_PROVIDER_SITE_OTHER): Payer: Medicare Other | Admitting: Critical Care Medicine

## 2013-02-19 ENCOUNTER — Encounter: Payer: Self-pay | Admitting: Critical Care Medicine

## 2013-02-19 VITALS — BP 92/62 | HR 80 | Temp 98.1°F | Ht 63.0 in | Wt 96.0 lb

## 2013-02-19 DIAGNOSIS — J449 Chronic obstructive pulmonary disease, unspecified: Secondary | ICD-10-CM

## 2013-02-19 DIAGNOSIS — J9611 Chronic respiratory failure with hypoxia: Secondary | ICD-10-CM | POA: Insufficient documentation

## 2013-02-19 DIAGNOSIS — J4489 Other specified chronic obstructive pulmonary disease: Secondary | ICD-10-CM

## 2013-02-19 DIAGNOSIS — J9610 Chronic respiratory failure, unspecified whether with hypoxia or hypercapnia: Secondary | ICD-10-CM

## 2013-02-19 NOTE — Patient Instructions (Addendum)
No change in medications. Stay on oxygen 2 liters rest and exertion, ok to bump to 2.5L if you want Return in     6 months

## 2013-02-19 NOTE — Progress Notes (Signed)
Subjective:    Patient ID: [name], female    DOB: 1941-09-30, 72 y.o.   MRN: [id number]  HPI  02/19/2013 Notes more cough with air temp changes.  Dyspnea the same   Using more oxygen 2.5L .   Pt denies any significant sore throat, nasal congestion or excess secretions, fever, chills, sweats, unintended weight loss, pleurtic or exertional chest pain, orthopnea PND, or leg swelling Pt denies any increase in rescue therapy over baseline, denies waking up needing it or having any early am or nocturnal exacerbations of coughing/wheezing/or dyspnea. Pt also denies any obvious fluctuation in symptoms with  weather or environmental change or other alleviating or aggravating factors    Past Medical History  Diagnosis Date  . COPD (chronic obstructive pulmonary disease)   . 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 H/O ETOH abuse   . Oxygen dependent     2l per nasal cannula  . GERD (gastroesophageal reflux disease)   . Osteopenia   . Arthritis   . Dementia   . Cataracts, bilateral   . Hx of blood clots      Family History  Problem Relation Age of Onset  . Emphysema Sister     smoker  . Asthma Daughter   . Heart disease Father   . Deep vein thrombosis Daughter      History   Social History  . Marital Status: Widowed    Spouse Name: N/A    Number of Children: 6  . Years of Education: N/A   Occupational History  . Unemployed    Social History Main Topics  . Smoking status: Former Smoker -- 0.50 packs/day for 50 years    Types: Cigarettes    Quit date: 02/21/2011  . Smokeless tobacco: Never Used  . Alcohol Use: No  . Drug Use: No  . Sexually Active: Not on file   Other Topics Concern  . Not on file   Social History Narrative  . No narrative on file     No Known Allergies   Outpatient Prescriptions Prior to Visit  Medication Sig Dispense Refill  . albuterol (PROVENTIL) (2.5 MG/3ML) 0.083% nebulizer solution Take 2.5 mg by nebulization every 6 (six) hours as needed  for shortness of breath.       . ALPRAZolam (XANAX) 0.25 MG tablet Take 0.25 mg by mouth at bedtime.       Marland Kitchen arformoterol (BROVANA) 15 MCG/2ML NEBU Take 15 mcg by nebulization 2 (two) times daily.        Marland Kitchen b complex vitamins tablet Take 1 tablet by mouth daily.      . benzonatate (TESSALON) 100 MG capsule Take 100 mg by mouth 2 (two) times daily as needed for cough.      . budesonide (PULMICORT) 0.5 MG/2ML nebulizer solution Take 0.5 mg by nebulization 2 (two) times daily.       . cetirizine (ZYRTEC) 10 MG tablet Take 10 mg by mouth daily.      . folic acid (FOLVITE) 1 MG tablet Take 1 mg by mouth daily.       Bertram Gala Glycol-Propyl Glycol (SYSTANE OP) Place 1 drop into both eyes daily as needed (for dry eyes).      Marland Kitchen tiotropium (SPIRIVA) 18 MCG inhalation capsule Place 18 mcg into inhaler and inhale daily.       . Vitamin D, Ergocalciferol, (DRISDOL) 50000 UNITS CAPS Take 50,000 Units by mouth every 7 (seven) days. Takes on mondays      .  famotidine (PEPCID) 20 MG tablet Take 20 mg by mouth daily.       No facility-administered medications prior to visit.       Review of Systems  Constitutional: Positive for appetite change and unexpected weight change. Negative for chills.  HENT: Positive for congestion and dental problem. Negative for nosebleeds, sneezing, trouble swallowing, voice change, postnasal drip and sinus pressure.   Eyes: Negative for visual disturbance.  Respiratory: Positive for cough. Negative for choking.   Gastrointestinal: Negative for diarrhea.  Genitourinary: Negative for difficulty urinating.  Musculoskeletal: Positive for arthralgias.  Neurological: Negative for tremors and syncope.  Hematological: Does not bruise/bleed easily.       Objective:   Physical Exam   Filed Vitals:   02/19/13 1004  BP: 92/62  Pulse: 80  Temp: 98.1 F (36.7 C)  TempSrc: Oral  Height: 5\' 3"  (1.6 m)  Weight: 96 lb (43.545 kg)  SpO2: 90%    Gen: Thin African American  female in no distress  ENT: No lesions,  mouth clear,  oropharynx clear, no postnasal drip  Neck: No JVD, no TMG, no carotid bruits  Lungs: No use of accessory muscles, no dullness to percussion, distant breath sounds  Cardiovascular: RRR, heart sounds normal, no murmur or gallops, no peripheral edema  Abdomen: soft and NT, no HSM,  BS normal  Musculoskeletal: No deformities, no cyanosis or clubbing  Neuro: alert, non focal  Skin: Warm, no lesions or rashes         Assessment & Plan:   COPD (chronic obstructive pulmonary disease)Gold D Gold stage D. COPD oxygen dependent with chronic respiratory failure stable at this time Plan Maintain brovana and budesonide daily by nebulization Maintain oxygen 2 L rest  2.5 L liters with exertion    Updated Medication List Outpatient Encounter Prescriptions as of 02/19/2013  Medication Sig Dispense Refill  . albuterol (PROVENTIL) (2.5 MG/3ML) 0.083% nebulizer solution Take 2.5 mg by nebulization every 6 (six) hours as needed for shortness of breath.       . ALPRAZolam (XANAX) 0.25 MG tablet Take 0.25 mg by mouth at bedtime.       Marland Kitchen arformoterol (BROVANA) 15 MCG/2ML NEBU Take 15 mcg by nebulization 2 (two) times daily.        Marland Kitchen b complex vitamins tablet Take 1 tablet by mouth daily.      . benzonatate (TESSALON) 100 MG capsule Take 100 mg by mouth 2 (two) times daily as needed for cough.      . budesonide (PULMICORT) 0.5 MG/2ML nebulizer solution Take 0.5 mg by nebulization 2 (two) times daily.       . cetirizine (ZYRTEC) 10 MG tablet Take 10 mg by mouth daily.      . famotidine (PEPCID) 20 MG tablet Take 20 mg by mouth daily.      . folic acid (FOLVITE) 1 MG tablet Take 1 mg by mouth daily.       Bertram Gala Glycol-Propyl Glycol (SYSTANE OP) Place 1 drop into both eyes daily as needed (for dry eyes).      Marland Kitchen tiotropium (SPIRIVA) 18 MCG inhalation capsule Place 18 mcg into inhaler and inhale daily.       . Vitamin D, Ergocalciferol,  (DRISDOL) 50000 UNITS CAPS Take 50,000 Units by mouth every 7 (seven) days. Takes on mondays      . [DISCONTINUED] famotidine (PEPCID) 20 MG tablet Take 20 mg by mouth daily.       No facility-administered encounter medications on  file as of 02/19/2013.

## 2013-02-19 NOTE — Assessment & Plan Note (Signed)
Gold stage D. COPD oxygen dependent with chronic respiratory failure stable at this time Plan Maintain brovana and budesonide daily by nebulization Maintain oxygen 2 L rest  2.5 L liters with exertion

## 2013-03-07 ENCOUNTER — Ambulatory Visit
Admission: RE | Admit: 2013-03-07 | Discharge: 2013-03-07 | Disposition: A | Discharge status: Home / Self Care | Payer: Medicare Other | Source: Ambulatory Visit

## 2013-03-07 DIAGNOSIS — Z1231 Encounter for screening mammogram for malignant neoplasm of breast: Secondary | ICD-10-CM

## 2013-05-27 ENCOUNTER — Observation Stay (HOSPITAL_COMMUNITY)
Admission: EM | Admit: 2013-05-27 | Discharge: 2013-05-29 | Disposition: A | Discharge status: Skilled Nursing Facility | Payer: Medicare Other | Attending: Internal Medicine | Admitting: Internal Medicine

## 2013-05-27 ENCOUNTER — Emergency Department (HOSPITAL_COMMUNITY): Payer: Medicare Other

## 2013-05-27 ENCOUNTER — Encounter (HOSPITAL_COMMUNITY): Payer: Self-pay | Admitting: Emergency Medicine

## 2013-05-27 DIAGNOSIS — Z79899 Other long term (current) drug therapy: Secondary | ICD-10-CM | POA: Insufficient documentation

## 2013-05-27 DIAGNOSIS — R509 Fever, unspecified: Secondary | ICD-10-CM | POA: Diagnosis present

## 2013-05-27 DIAGNOSIS — J449 Chronic obstructive pulmonary disease, unspecified: Secondary | ICD-10-CM

## 2013-05-27 DIAGNOSIS — R404 Transient alteration of awareness: Principal | ICD-10-CM | POA: Insufficient documentation

## 2013-05-27 DIAGNOSIS — R4182 Altered mental status, unspecified: Secondary | ICD-10-CM

## 2013-05-27 DIAGNOSIS — R410 Disorientation, unspecified: Secondary | ICD-10-CM

## 2013-05-27 DIAGNOSIS — M899 Disorder of bone, unspecified: Secondary | ICD-10-CM | POA: Insufficient documentation

## 2013-05-27 DIAGNOSIS — J4489 Other specified chronic obstructive pulmonary disease: Secondary | ICD-10-CM | POA: Insufficient documentation

## 2013-05-27 DIAGNOSIS — E43 Unspecified severe protein-calorie malnutrition: Secondary | ICD-10-CM | POA: Insufficient documentation

## 2013-05-27 DIAGNOSIS — J9610 Chronic respiratory failure, unspecified whether with hypoxia or hypercapnia: Secondary | ICD-10-CM | POA: Insufficient documentation

## 2013-05-27 DIAGNOSIS — G9340 Encephalopathy, unspecified: Secondary | ICD-10-CM | POA: Insufficient documentation

## 2013-05-27 DIAGNOSIS — K219 Gastro-esophageal reflux disease without esophagitis: Secondary | ICD-10-CM | POA: Insufficient documentation

## 2013-05-27 DIAGNOSIS — F1027 Alcohol dependence with alcohol-induced persisting dementia: Secondary | ICD-10-CM | POA: Insufficient documentation

## 2013-05-27 DIAGNOSIS — J9611 Chronic respiratory failure with hypoxia: Secondary | ICD-10-CM | POA: Diagnosis present

## 2013-05-27 DIAGNOSIS — F1020 Alcohol dependence, uncomplicated: Secondary | ICD-10-CM | POA: Insufficient documentation

## 2013-05-27 LAB — URINALYSIS, ROUTINE W REFLEX MICROSCOPIC
Bilirubin Urine: NEGATIVE
Glucose, UA: NEGATIVE mg/dL
Hgb urine dipstick: NEGATIVE
Protein, ur: NEGATIVE mg/dL

## 2013-05-27 LAB — URINE MICROSCOPIC-ADD ON

## 2013-05-27 LAB — CBC
HCT: 34 % — ABNORMAL LOW (ref 36.0–46.0)
Hemoglobin: 10.7 g/dL — ABNORMAL LOW (ref 12.0–15.0)
MCH: 23.5 pg — ABNORMAL LOW (ref 26.0–34.0)
MCV: 74.7 fL — ABNORMAL LOW (ref 78.0–100.0)
RBC: 4.55 MIL/uL (ref 3.87–5.11)
WBC: 8.1 10*3/uL (ref 4.0–10.5)

## 2013-05-27 LAB — COMPREHENSIVE METABOLIC PANEL
AST: 18 U/L (ref 0–37)
BUN: 8 mg/dL (ref 6–23)
CO2: 31 mEq/L (ref 19–32)
Calcium: 10.3 mg/dL (ref 8.4–10.5)
Chloride: 102 mEq/L (ref 96–112)
Creatinine, Ser: 0.74 mg/dL (ref 0.50–1.10)
GFR calc Af Amer: >90 mL/min (ref >90)
GFR calc non Af Amer: 84 mL/min — ABNORMAL LOW (ref >90)
Glucose, Bld: 99 mg/dL (ref 70–99)
Total Bilirubin: 1 mg/dL (ref 0.3–1.2)

## 2013-05-27 LAB — POCT I-STAT TROPONIN I: Troponin i, poc: 0 ng/mL (ref 0.00–0.08)

## 2013-05-27 LAB — AMMONIA: Ammonia: 11 umol/L (ref 11–60)

## 2013-05-27 MED ORDER — HALOPERIDOL LACTATE 5 MG/ML IJ SOLN: 2.0000 mg | Freq: Four times a day (QID) | INTRAMUSCULAR | Status: DC | PRN

## 2013-05-27 MED ORDER — POLYETHYL GLYCOL-PROPYL GLYCOL 0.4-0.3 % OP SOLN: 1.0000 [drp] | Freq: Every day | OPHTHALMIC | Status: DC

## 2013-05-27 MED ORDER — POLYVINYL ALCOHOL 1.4 % OP SOLN
1.0000 [drp] | Freq: Every day | OPHTHALMIC | Status: DC
  Administered 2013-05-27 – 2013-05-29 (×3): 1 [drp] via OPHTHALMIC
  Filled 2013-05-27: qty 15

## 2013-05-27 MED ORDER — LEVOFLOXACIN 500 MG PO TABS
500.0000 mg | ORAL_TABLET | Freq: Once | ORAL | Status: DC
  Filled 2013-05-27: qty 1

## 2013-05-27 MED ORDER — ONDANSETRON HCL 4 MG PO TABS: 4.0000 mg | ORAL_TABLET | Freq: Four times a day (QID) | ORAL | Status: DC | PRN

## 2013-05-27 MED ORDER — FAMOTIDINE 20 MG PO TABS
20.0000 mg | ORAL_TABLET | Freq: Every day | ORAL | Status: DC
  Administered 2013-05-28: 20 mg via ORAL
  Filled 2013-05-27 (×3): qty 1

## 2013-05-27 MED ORDER — ENOXAPARIN SODIUM 40 MG/0.4ML ~~LOC~~ SOLN
40.0000 mg | SUBCUTANEOUS | Status: DC
  Administered 2013-05-27: 40 mg via SUBCUTANEOUS
  Filled 2013-05-27 (×2): qty 0.4

## 2013-05-27 MED ORDER — ALPRAZOLAM 0.25 MG PO TABS
0.2500 mg | ORAL_TABLET | Freq: Every day | ORAL | Status: DC
  Administered 2013-05-28: 0.25 mg via ORAL
  Filled 2013-05-27: qty 1

## 2013-05-27 MED ORDER — TIOTROPIUM BROMIDE MONOHYDRATE 18 MCG IN CAPS
18.0000 ug | ORAL_CAPSULE | Freq: Every morning | RESPIRATORY_TRACT | Status: DC
  Administered 2013-05-28 – 2013-05-29 (×2): 18 ug via RESPIRATORY_TRACT
  Filled 2013-05-27: qty 5

## 2013-05-27 MED ORDER — MEMANTINE HCL 28 X 5 MG & 21 X 10 MG PO TABS: ORAL_TABLET | ORAL | Status: DC

## 2013-05-27 MED ORDER — BUDESONIDE 0.5 MG/2ML IN SUSP
0.5000 mg | Freq: Two times a day (BID) | RESPIRATORY_TRACT | Status: DC
  Administered 2013-05-27 – 2013-05-29 (×4): 0.5 mg via RESPIRATORY_TRACT
  Filled 2013-05-27 (×6): qty 2

## 2013-05-27 MED ORDER — VITAMIN D (ERGOCALCIFEROL) 1.25 MG (50000 UNIT) PO CAPS: 50000.0000 [IU] | ORAL_CAPSULE | ORAL | Status: DC

## 2013-05-27 MED ORDER — ACETAMINOPHEN 325 MG PO TABS
650.0000 mg | ORAL_TABLET | Freq: Four times a day (QID) | ORAL | Status: DC | PRN
  Administered 2013-05-28: 650 mg via ORAL
  Filled 2013-05-27 (×2): qty 2

## 2013-05-27 MED ORDER — MEMANTINE HCL 5 MG PO TABS
5.0000 mg | ORAL_TABLET | Freq: Every day | ORAL | Status: DC
  Administered 2013-05-28: 5 mg via ORAL
  Filled 2013-05-27 (×3): qty 1

## 2013-05-27 MED ORDER — ONDANSETRON HCL 4 MG/2ML IJ SOLN: 4.0000 mg | Freq: Four times a day (QID) | INTRAMUSCULAR | Status: DC | PRN

## 2013-05-27 MED ORDER — FOLIC ACID 1 MG PO TABS
1.0000 mg | ORAL_TABLET | Freq: Every morning | ORAL | Status: DC
  Administered 2013-05-28 – 2013-05-29 (×2): 1 mg via ORAL
  Filled 2013-05-27 (×2): qty 1

## 2013-05-27 MED ORDER — LORATADINE 10 MG PO TABS
10.0000 mg | ORAL_TABLET | Freq: Every day | ORAL | Status: DC
  Administered 2013-05-28 – 2013-05-29 (×2): 10 mg via ORAL
  Filled 2013-05-27 (×2): qty 1

## 2013-05-27 MED ORDER — DICLOFENAC SODIUM 50 MG PO TBEC
50.0000 mg | DELAYED_RELEASE_TABLET | Freq: Two times a day (BID) | ORAL | Status: DC | PRN
  Filled 2013-05-27: qty 1

## 2013-05-27 MED ORDER — ACETAMINOPHEN 650 MG RE SUPP: 650.0000 mg | Freq: Four times a day (QID) | RECTAL | Status: DC | PRN

## 2013-05-27 MED ORDER — ALUM & MAG HYDROXIDE-SIMETH 200-200-20 MG/5ML PO SUSP: 30.0000 mL | Freq: Four times a day (QID) | ORAL | Status: DC | PRN

## 2013-05-27 MED ORDER — BENZONATATE 100 MG PO CAPS
100.0000 mg | ORAL_CAPSULE | Freq: Three times a day (TID) | ORAL | Status: DC | PRN
  Filled 2013-05-27: qty 2

## 2013-05-27 MED ORDER — LEVOFLOXACIN 500 MG PO TABS
500.0000 mg | ORAL_TABLET | Freq: Every day | ORAL | Status: DC
  Administered 2013-05-27 – 2013-05-28 (×2): 500 mg via ORAL
  Filled 2013-05-27 (×2): qty 1

## 2013-05-27 MED ORDER — SODIUM CHLORIDE 0.9 % IV SOLN
INTRAVENOUS | Status: DC
  Administered 2013-05-27 – 2013-05-29 (×2): via INTRAVENOUS

## 2013-05-27 MED ORDER — SODIUM CHLORIDE 0.9 % IV BOLUS (SEPSIS)
1000.0000 mL | Freq: Once | INTRAVENOUS | Status: AC
  Administered 2013-05-27: 1000 mL via INTRAVENOUS

## 2013-05-27 MED ORDER — ALBUTEROL SULFATE HFA 108 (90 BASE) MCG/ACT IN AERS
2.0000 | INHALATION_SPRAY | Freq: Four times a day (QID) | RESPIRATORY_TRACT | Status: DC | PRN
  Filled 2013-05-27: qty 6.7

## 2013-05-27 MED ORDER — ACETAMINOPHEN 500 MG PO TABS
1000.0000 mg | ORAL_TABLET | Freq: Once | ORAL | Status: AC
  Administered 2013-05-27: 1000 mg via ORAL
  Filled 2013-05-27: qty 2

## 2013-05-27 MED ORDER — ALBUTEROL SULFATE (5 MG/ML) 0.5% IN NEBU: 2.5000 mg | INHALATION_SOLUTION | Freq: Four times a day (QID) | RESPIRATORY_TRACT | Status: DC | PRN

## 2013-05-27 MED ORDER — POLYETHYLENE GLYCOL 3350 17 G PO PACK
17.0000 g | PACK | Freq: Every day | ORAL | Status: DC | PRN
  Filled 2013-05-27: qty 1

## 2013-05-27 MED ORDER — ARFORMOTEROL TARTRATE 15 MCG/2ML IN NEBU
15.0000 ug | INHALATION_SOLUTION | Freq: Two times a day (BID) | RESPIRATORY_TRACT | Status: DC
  Administered 2013-05-27 – 2013-05-29 (×4): 15 ug via RESPIRATORY_TRACT
  Filled 2013-05-27 (×9): qty 2

## 2013-05-27 MED ORDER — MEMANTINE HCL 10 MG PO TABS: 10.0000 mg | ORAL_TABLET | Freq: Two times a day (BID) | ORAL | Status: DC

## 2013-05-27 MED ORDER — MEMANTINE HCL 10 MG PO TABS
10.0000 mg | ORAL_TABLET | Freq: Every day | ORAL | Status: DC
  Administered 2013-05-28 – 2013-05-29 (×2): 10 mg via ORAL
  Filled 2013-05-27 (×2): qty 1

## 2013-05-27 NOTE — Progress Notes (Signed)
Proxy form given for My Chart to patient's daughter. Briscoe Burns BSN, RN-BC Admissions RN  05/27/2013 4:02 PM

## 2013-05-27 NOTE — ED Notes (Signed)
Patient aware of need for urine sample.Unable to void at this time

## 2013-05-27 NOTE — H&P (Signed)
[facility] History and Physical  [name] ZOX:[id number] DOB: 29-Oct-1940 DOA: 05/27/2013  Referring physician: Dr. Pricilla [name] PCP: Jodi German, MD   Chief Complaint: Altered mental status  History of Present Illness: [name] is an 72 y.o. female with Jodi PMH of dementia, COPD and chronic respiratory failure on home oxygen who was brought to the hospital with changes in her mental status, hallucinations, delirium type symptoms that have developed over the past 24 hours, including seeing and talking to dead people.  She has also had Jodi 2 day history of more labored breathing in the setting of Jodi recent head cold.  She also has had pain in her legs including cramps in the left calf.  No subjective fever or chills.  Her appetite has been poor. No aggravating or alleviating factors. Her family, who accompanies her here today, reports that she has had similar presentation in the past with pneumonia.  Review of Systems: Constitutional: No fever, no chills;  Appetite poor; No weight loss, no weight gain, no fatigue.  HEENT: No blurry vision, no diplopia, + recent stye, no pharyngitis, no dysphagia CV: No chest pain, no palpitations.  Resp: No SOB, no cough. GI: No nausea, no vomiting, no diarrhea, no melena, no hematochezia.  GU: No dysuria, no hematuria. MSK: + leg myalgias, no arthralgias.  Neuro:  + headache, no focal neurological deficits, no history of seizures.  Psych: No depression, no anxiety.  Endo: No heat intolerance, no cold intolerance, no polyuria, no polydipsia  Skin: No rashes, no skin lesions.  Heme: No easy bruising.  Past Medical History Past Medical History  Diagnosis Date  . COPD (chronic obstructive pulmonary disease)   . 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 H/O ETOH abuse   . Oxygen dependent     2l per nasal cannula  . GERD (gastroesophageal reflux disease)   . Osteopenia   . Arthritis   . Dementia   . Cataracts, bilateral   . Hx of blood clots       Past Surgical History Past Surgical History  Procedure Laterality Date  . Hip surgery    . Fracture surgery    . Cataract extraction w/phaco Right 01/08/2013    Procedure: PHACOEMULSIFICATION, POSTERIOR CHAMBER INTRAOCULAR LENS PLACEMENT;  Surgeon: Jodi Flood, MD;  Location: [facility] OR;  Service: Ophthalmology;  Laterality: Right;  ATTEMPTED LOCAL ANESTHESIA; PATIENT RE-PREPPED AND DRAPED FOR GENERAL ANESTHESIA     Social History: History   Social History  . Marital Status: Widowed    Spouse Name: [name]    Number of Children: 6  . Years of Education: 10   Occupational History  . [name], retired.    Social History Main Topics  . Smoking status: Former Smoker -- 0.50 packs/day for 50 years    Types: Cigarettes    Quit date: 02/21/2011  . Smokeless tobacco: Never Used  . Alcohol Use: No     Comment: H/O abuse.  None now.  . Drug Use: No  . Sexually Active: Not on file   Other Topics Concern  . Not on file   Social History Narrative   Widowed.  Lives with daughter [name].  Ambulates without assistance at [place].    Family History:  Family History  Problem Relation Age of Onset  . Emphysema Sister     smoker  . Asthma Daughter   . Heart disease Father   . Deep vein thrombosis Daughter     Allergies: Review of patient's allergies  indicates no known allergies.  Meds: Prior to Admission medications   Medication Sig Start Date End Date Taking? Authorizing Provider  albuterol (PROAIR HFA) 108 (90 BASE) MCG/ACT inhaler Inhale 2 puffs into the lungs every 6 (six) hours as needed for shortness of breath.    Yes Historical Provider, MD  albuterol (PROVENTIL) (2.5 MG/3ML) 0.083% nebulizer solution Take 2.5 mg by nebulization every 6 (six) hours as needed for shortness of breath.    Yes Historical Provider, MD  ALPRAZolam (XANAX) 0.25 MG tablet Take 0.25 mg by mouth at bedtime.    Yes Historical Provider, MD  arformoterol (BROVANA) 15 MCG/2ML NEBU Take 15 mcg by  nebulization 2 (two) times daily.     Yes Historical Provider, MD  benzonatate (TESSALON) 100 MG capsule Take 100-200 mg by mouth every 8 (eight) hours as needed for cough.    Yes Historical Provider, MD  budesonide (PULMICORT) 0.5 MG/2ML nebulizer solution Take 0.5 mg by nebulization 2 (two) times daily.    Yes Historical Provider, MD  cetirizine (ZYRTEC) 10 MG tablet Take 10 mg by mouth every morning.    Yes Historical Provider, MD  diclofenac (VOLTAREN) 50 MG EC tablet Take 50 mg by mouth 2 (two) times daily as needed (For knee pain.).   Yes Historical Provider, MD  famotidine (PEPCID) 20 MG tablet Take 20 mg by mouth at bedtime.    Yes Historical Provider, MD  folic acid (FOLVITE) 1 MG tablet Take 1 mg by mouth every morning.    Yes Historical Provider, MD  memantine [facility] TITRATION PACK) tablet pack Take by mouth See admin instructions. 5 mg/day for =1 week; 5 mg twice daily for =1 week; 15 mg/day given in 5 mg and 10 mg separated doses for =1 week; then 10 mg twice daily   Yes Historical Provider, MD  Polyethyl Glycol-Propyl Glycol (SYSTANE OP) Place 1 drop into the right eye daily as needed (for dry eyes).    Yes Historical Provider, MD  tiotropium (SPIRIVA) 18 MCG inhalation capsule Place 18 mcg into inhaler and inhale every morning.    Yes Historical Provider, MD  Vitamin D, Ergocalciferol, (DRISDOL) 50000 UNITS CAPS Take 50,000 Units by mouth every Monday.    Yes Historical Provider, MD    Physical Exam: Filed Vitals:   05/27/13 1232 05/27/13 1239 05/27/13 1240 05/27/13 1321  BP:   104/65   Pulse:  103 104   Temp:  99.7 F (37.6 C)  100.8 F (38.2 C)  TempSrc:  Oral  Rectal  Resp:  16 19   SpO2: 94% 97% 98%      Physical Exam: Blood pressure 104/65, pulse 104, temperature 100.8 F (38.2 C), temperature source Rectal, resp. rate 19, SpO2 98.00%. Gen: No acute distress. Head: Normocephalic, atraumatic. Eyes: PERRL, EOMI, sclerae nonicteric. Mouth: Oropharynx clear. Tongue  midline. Palate rises symmetrically. Neck: Supple, no thyromegaly, no lymphadenopathy, no jugular venous distention. Chest: Lungs diminished throughout. No wheeze, rales, or rhonchi. CV: Heart sounds are regular. No murmurs, rubs, or gallops. Abdomen: Soft, nontender, nondistended with normal active bowel sounds. Extremities: Extremities are without clubbing, edema, or cyanosis. Skin: Warm and dry. Neuro: Alert and oriented times 2; cranial nerves II through XII grossly intact. Strength appears to be intact and equal. Psych: Mood and affect normal.  Labs on Admission:  Basic Metabolic Panel:  Recent Labs Lab 05/27/13 1342  NA 139  K 3.5  CL 102  CO2 31  GLUCOSE 99  BUN 8  CREATININE 0.74  CALCIUM 10.3   Liver Function Tests:  Recent Labs Lab 05/27/13 1342  AST 18  ALT 11  ALKPHOS 79  BILITOT 1.0  PROT 7.2  ALBUMIN 3.7    Recent Labs Lab 05/27/13 1342  AMMONIA 11   CBC:  Recent Labs Lab 05/27/13 1342  WBC 8.1  HGB 10.7*  HCT 34.0*  MCV 74.7*  PLT 161   Urinalysis    Component Value Date/Time   COLORURINE YELLOW 05/27/2013 1425   APPEARANCEUR CLEAR 05/27/2013 1425   LABSPEC 1.010 05/27/2013 1425   PHURINE 7.5 05/27/2013 1425   GLUCOSEU NEGATIVE 05/27/2013 1425   HGBUR NEGATIVE 05/27/2013 1425   BILIRUBINUR NEGATIVE 05/27/2013 1425   KETONESUR NEGATIVE 05/27/2013 1425   PROTEINUR NEGATIVE 05/27/2013 1425   UROBILINOGEN 0.2 05/27/2013 1425   NITRITE NEGATIVE 05/27/2013 1425   LEUKOCYTESUR TRACE* 05/27/2013 1425    Radiological Exams on Admission: Dg Chest 2 View  05/27/2013   *RADIOLOGY REPORT*  Clinical Data: Altered mental status, cough, COPD  CHEST - 2 VIEW  Comparison: Prior chest x-ray 03/01/2012  Findings: Stable appearance of the lungs with severe emphysematous and bronchitic changes.  Prominent left nipple shadow is similar to prior.  Cardiac and mediastinal contours remain within normal limits.  No pleural effusion or pneumothorax.  No edema.  Diffuse osteopenia.   No acute osseous abnormality.  IMPRESSION:  Stable chest x-ray with changes of advanced COPD/emphysema.  No acute cardiopulmonary disease.   Original Report Authenticated By: Malachy Moan, M.D.   Ct Head Wo Contrast  05/27/2013   *RADIOLOGY REPORT*  Clinical Data: Altered mental status.  Confusion. Dementia.  CT HEAD WITHOUT CONTRAST  Technique:  Contiguous axial images were obtained from the base of the skull through the vertex without contrast.  Comparison: None.  Findings: No intracranial hemorrhage.  No CT evidence of large acute infarct.  No intracranial mass lesion detected on this unenhanced exam.  Mild age related atrophy without hydrocephalus  Mastoid air cells, and middle ear cavities and visualized paranasal sinuses are clear.  IMPRESSION: No acute abnormality.  Please see above   Original Report Authenticated By: Lacy Duverney, M.D.    EKG: Pending.  Assessment/Plan Principal Problem:   Delirium, acute -May be from occult pneumonia or UTI. Initial chest radiograph negative, but with underlying COPD and possible dehydration, may take Jodi while to evolve. -Leukocytes in urine but no nitrites. Followup cultures. -Treat with empiric Levaquin. -Haldol as needed for agitation/hallucinations. Active Problems:   COPD (chronic obstructive pulmonary disease)[facility] -No evidence of acute exacerbation at present. -Continue supplemental oxygen. -Continue usual home medications including Brovana, Pulmicort, Spiriva and albuterol.   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 -PT/OT, social work consultations.   Chronic respiratory failure -Continue supplemental oxygen.   Fever -May be from occult UTI or developing pneumonia. On empiric Levaquin.   Severe protein-calorie malnutrition -Dietitian consultation requested. Patient with poor appetite and appears underweight.  Code Status: Full. Family Communication: Family at bedside. Disposition Plan: Home versus SNF  depending on progress.  Time spent: 1 hour.  [name],[name] [facility] Pager [phone]  If 7PM-7AM, please contact night-coverage [email or web address] Password [facility] 05/27/2013, 3:56 PM

## 2013-05-27 NOTE — Progress Notes (Signed)
CSW met with pt and daughters at bedside.  Pts daughter, Fletcher Anon [phone]) is pts HCPOA. Ms. Providence Lanius states that she interested in possible SNF placement.  CSW gave family listings of SNF to review.  She states that she would also interested in talking with [facility] concerning what options are for services in the home.  Family thanked CSW for concern and support.    Marva Panda, Theresia Majors  [phone] .05/27/2013 4:31

## 2013-05-27 NOTE — ED Provider Notes (Signed)
CSN: [id number]     Arrival date & time 05/27/13  1229 History     First MD Initiated Contact with Patient 05/27/13 1300     Chief Complaint  Patient presents with  . Altered Mental Status   (Consider location/radiation/quality/duration/timing/severity/associated sxs/prior Treatment) Patient is a 72 y.o. female presenting with altered mental status. The history is provided by a relative.  Altered Mental Status Presenting symptoms comment:  Intermittent hallucinations Severity:  Moderate Most recent episode:  Today Episode history:  Multiple Duration: several days. Timing:  Intermittent Progression:  Worsening Chronicity:  New Context: dementia   Context: not a nursing home resident, not a recent illness and not a recent infection   Associated symptoms: no abdominal pain, no difficulty breathing, no fever, no headaches, no rash and no vomiting     Past Medical History  Diagnosis Date  . COPD (chronic obstructive pulmonary disease)   . 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 H/O ETOH abuse   . Oxygen dependent     2l per nasal cannula  . GERD (gastroesophageal reflux disease)   . Osteopenia   . Arthritis   . Dementia   . Cataracts, bilateral   . Hx of blood clots    Past Surgical History  Procedure Laterality Date  . Hip surgery    . Fracture surgery    . Cataract extraction w/phaco Right 01/08/2013    Procedure: PHACOEMULSIFICATION, POSTERIOR CHAMBER INTRAOCULAR LENS PLACEMENT;  Surgeon: Shade Flood, MD;  Location: [facility] OR;  Service: Ophthalmology;  Laterality: Right;  ATTEMPTED LOCAL ANESTHESIA; PATIENT RE-PREPPED AND DRAPED FOR GENERAL ANESTHESIA   Family History  Problem Relation Age of Onset  . Emphysema Sister     smoker  . Asthma Daughter   . Heart disease Father   . Deep vein thrombosis Daughter    History  Substance Use Topics  . Smoking status: Former Smoker -- 0.50 packs/day for 50 years    Types: Cigarettes    Quit date: 02/21/2011  . Smokeless tobacco: Never  Used  . Alcohol Use: No   OB History   Grav Para Term Preterm Abortions TAB SAB Ect Mult Living                 Review of Systems  Unable to perform ROS: Dementia  Constitutional: Negative for fever.  Respiratory: Positive for cough. Negative for shortness of breath.   Cardiovascular: Positive for chest pain (difficult for patient and family to describe, most recent episode a few hours ago).  Gastrointestinal: Negative for vomiting, abdominal pain and diarrhea.  Genitourinary: Negative for decreased urine volume.  Skin: Negative for rash.  Neurological: Negative for headaches.  Psychiatric/Behavioral: Positive for altered mental status.    Allergies  Review of patient's allergies indicates no known allergies.  Home Medications   Current Outpatient Rx  Name  Route  Sig  Dispense  Refill  . albuterol (PROVENTIL) (2.5 MG/3ML) 0.083% nebulizer solution   Nebulization   Take 2.5 mg by nebulization every 6 (six) hours as needed for shortness of breath.          . ALPRAZolam (XANAX) 0.25 MG tablet   Oral   Take 0.25 mg by mouth at bedtime.          Marland Kitchen arformoterol (BROVANA) 15 MCG/2ML NEBU   Nebulization   Take 15 mcg by nebulization 2 (two) times daily.           Marland Kitchen b complex vitamins tablet   Oral  Take 1 tablet by mouth daily.         . benzonatate (TESSALON) 100 MG capsule   Oral   Take 100 mg by mouth 2 (two) times daily as needed for cough.         . budesonide (PULMICORT) 0.5 MG/2ML nebulizer solution   Nebulization   Take 0.5 mg by nebulization 2 (two) times daily.          . cetirizine (ZYRTEC) 10 MG tablet   Oral   Take 10 mg by mouth daily.         . famotidine (PEPCID) 20 MG tablet   Oral   Take 20 mg by mouth daily.         . folic acid (FOLVITE) 1 MG tablet   Oral   Take 1 mg by mouth daily.          Bertram Gala Glycol-Propyl Glycol (SYSTANE OP)   Both Eyes   Place 1 drop into both eyes daily as needed (for dry eyes).          Marland Kitchen tiotropium (SPIRIVA) 18 MCG inhalation capsule   Inhalation   Place 18 mcg into inhaler and inhale daily.          . Vitamin D, Ergocalciferol, (DRISDOL) 50000 UNITS CAPS   Oral   Take 50,000 Units by mouth every 7 (seven) days. Takes on mondays          Pulse 103  Temp(Src) 99.7 F (37.6 C) (Oral)  Resp 16  SpO2 97% Physical Exam  Vitals reviewed. Constitutional: She appears well-developed and well-nourished.  HENT:  Head: Normocephalic and atraumatic.  Right Ear: External ear normal.  Left Ear: External ear normal.  Nose: Nose normal.  Eyes: EOM are normal. Pupils are equal, round, and reactive to light. Right eye exhibits no discharge. Left eye exhibits no discharge.  Neck: Normal range of motion and full passive range of motion without pain. Neck supple. No rigidity.  Cardiovascular: Normal rate, regular rhythm and normal heart sounds.   Pulmonary/Chest: Effort normal and breath sounds normal. She has no wheezes. She has no rales.  Abdominal: Soft. There is no tenderness.  Neurological: She is alert. She has normal strength. No cranial nerve deficit or sensory deficit. GCS eye subscore is 4. GCS verbal subscore is 4. GCS motor subscore is 6.  Skin: Skin is warm and dry.    ED Course   Procedures (including critical care time)  Labs Reviewed  URINALYSIS, ROUTINE W REFLEX MICROSCOPIC - Abnormal; Notable for the following:    Leukocytes, UA TRACE (*)    All other components within normal limits  CBC - Abnormal; Notable for the following:    Hemoglobin 10.7 (*)    HCT 34.0 (*)    MCV 74.7 (*)    MCH 23.5 (*)    All other components within normal limits  COMPREHENSIVE METABOLIC PANEL - Abnormal; Notable for the following:    GFR calc non Af Amer 84 (*)    All other components within normal limits  URINE MICROSCOPIC-ADD ON - Abnormal; Notable for the following:    Squamous Epithelial / LPF FEW (*)    All other components within normal limits  URINE CULTURE   AMMONIA  CG4 I-STAT (LACTIC ACID)  POCT I-STAT TROPONIN I   Dg Chest 2 View  05/27/2013   *RADIOLOGY REPORT*  Clinical Data: Altered mental status, cough, COPD  CHEST - 2 VIEW  Comparison: Prior chest x-ray 03/01/2012  Findings: Stable appearance of the lungs with severe emphysematous and bronchitic changes.  Prominent left nipple shadow is similar to prior.  Cardiac and mediastinal contours remain within normal limits.  No pleural effusion or pneumothorax.  No edema.  Diffuse osteopenia.  No acute osseous abnormality.  IMPRESSION:  Stable chest x-ray with changes of advanced COPD/emphysema.  No acute cardiopulmonary disease.   Original Report Authenticated By: Malachy Moan, M.D.   Ct Head Wo Contrast  05/27/2013   *RADIOLOGY REPORT*  Clinical Data: Altered mental status.  Confusion. Dementia.  CT HEAD WITHOUT CONTRAST  Technique:  Contiguous axial images were obtained from the base of the skull through the vertex without contrast.  Comparison: None.  Findings: No intracranial hemorrhage.  No CT evidence of large acute infarct.  No intracranial mass lesion detected on this unenhanced exam.  Mild age related atrophy without hydrocephalus  Mastoid air cells, and middle ear cavities and visualized paranasal sinuses are clear.  IMPRESSION: No acute abnormality.  Please see above   Original Report Authenticated By: Lacy Duverney, M.D.   1. Altered mental status     MDM  72 yo female with dementia with intermittent hallucinations over past few days. No other signs/symptoms noted by family. Is currently on namenda for dementia over past few weeks, but patient is now getting worse. No obvious source but patient does have low grade rectal temp here. No PNA, but has numerous resp sx, which is likely source of fever. With patient at baseline during eval and with normal neck and neuro exam (besides orientation), feel meningitis is very unlikely. Will cover with levaquin. Discussed treating at outpatient, but bc  of patient's intermittent aggressiveness at home, as well as decreased po intake, family does not feel comfortable taking care of her at home with her changed mental status, so will put in obs to hospitalist for abx and monitoring.  Audree Camel, MD 05/27/13 [phone]

## 2013-05-27 NOTE — ED Notes (Signed)
ZOX:[id number]<AV> Expected date:<BR> Expected time:<BR> Means of arrival:<BR> Comments:<BR> weakness

## 2013-05-27 NOTE — ED Notes (Signed)
Per [facility], pt from home. Family reports pt has not been eating right, and not been "acting right " for a couple of weeks. Pt family reports increased confusion today and hasn't eaten. Pt has hx of dementia , per EMS pt alert at baseline. Pt reported to EMS that she had a dream about her mom last night and has been sad.

## 2013-05-28 DIAGNOSIS — J449 Chronic obstructive pulmonary disease, unspecified: Secondary | ICD-10-CM

## 2013-05-28 DIAGNOSIS — J9610 Chronic respiratory failure, unspecified whether with hypoxia or hypercapnia: Secondary | ICD-10-CM

## 2013-05-28 DIAGNOSIS — R404 Transient alteration of awareness: Secondary | ICD-10-CM

## 2013-05-28 LAB — BASIC METABOLIC PANEL
CO2: 27 mEq/L (ref 19–32)
Chloride: 104 mEq/L (ref 96–112)
Creatinine, Ser: 0.77 mg/dL (ref 0.50–1.10)
Glucose, Bld: 193 mg/dL — ABNORMAL HIGH (ref 70–99)
Sodium: 138 mEq/L (ref 135–145)

## 2013-05-28 LAB — CBC
Hemoglobin: 9.3 g/dL — ABNORMAL LOW (ref 12.0–15.0)
MCV: 75.2 fL — ABNORMAL LOW (ref 78.0–100.0)
Platelets: 140 10*3/uL — ABNORMAL LOW (ref 150–400)
RBC: 4.03 MIL/uL (ref 3.87–5.11)
WBC: 6.8 10*3/uL (ref 4.0–10.5)

## 2013-05-28 MED ORDER — ENOXAPARIN SODIUM 30 MG/0.3ML ~~LOC~~ SOLN
30.0000 mg | SUBCUTANEOUS | Status: DC
  Administered 2013-05-28: 30 mg via SUBCUTANEOUS
  Filled 2013-05-28 (×2): qty 0.3

## 2013-05-28 MED ORDER — POTASSIUM CHLORIDE CRYS ER 20 MEQ PO TBCR
40.0000 meq | EXTENDED_RELEASE_TABLET | Freq: Once | ORAL | Status: AC
  Administered 2013-05-28: 40 meq via ORAL
  Filled 2013-05-28: qty 2

## 2013-05-28 MED ORDER — ENSURE COMPLETE PO LIQD
237.0000 mL | Freq: Two times a day (BID) | ORAL | Status: DC
  Administered 2013-05-28 – 2013-05-29 (×2): 237 mL via ORAL

## 2013-05-28 NOTE — Clinical Social Work Psychosocial (Addendum)
    Clinical Social Work Department BRIEF PSYCHOSOCIAL ASSESSMENT 05/28/2013  Patient:  [name], [name]     Account Number:  [id number]     Admit date:  05/27/2013  Clinical Social Worker:  Hattie Perch  Date/Time:  05/28/2013 12:00 M  Referred by:  Physician  Date Referred:  05/28/2013 Referred for  SNF Placement   Other Referral:   Interview type:  Patient Other interview type:   daughter at bedside    PSYCHOSOCIAL DATA Living Status:  FAMILY Admitted from facility:   Level of care:   Primary support name:  Fletcher Anon Primary support relationship to patient:  CHILD, ADULT Degree of support available:   good    CURRENT CONCERNS Current Concerns  Post-Acute Placement   Other Concerns:    SOCIAL WORK ASSESSMENT / PLAN CSW met with patient and patients daughter at bedside. patient is alert and appears oriented. Discussed need for snf placement. patients daughter states that patient "always goes to [facility] on [address]". patient and daughter agreeable to [facility] [place].   Assessment/plan status:   Other assessment/ plan:   Information/referral to community resources:    PATIENT'S/FAMILY'S RESPONSE TO PLAN OF CARE: patient was pleasant and agreeable to snf at [facility] [place]. daughter was pleased for patient to go back there. Daughter later came out and stated that she has changed her mind and would like for patient to go to [facility] as she thinks patient will need long term care at this point.

## 2013-05-28 NOTE — Evaluation (Signed)
Physical Therapy Evaluation Patient Details Name: [name] MRN: [id number] DOB: Feb 20, 1941 Today's Date: 05/28/2013 Time: 1140-1200 PT Time Calculation (min): 20 min  PT Assessment / Plan / Recommendation History of Present Illness  72 y.o. female with a PMH of dementia, COPD and chronic respiratory failure on home oxygen who was brought to the hospital with changes in her mental status, hallucinations, delirium type symptoms that have developed over the past 24 hours, including seeing and talking to dead people.  She has also had a 2 day history of more labored breathing in the setting of a recent head cold.  She also has had pain in her legs including cramps in the left calf.  No subjective fever or chills.  Her appetite has been poor. No aggravating or alleviating factors. Her family, who accompanies her here today, reports that she has had similar presentation in the past with pneumonia.  Clinical Impression  On eval, pt required Min assist for mobility-able to ambulate ~70 feet with RW. Demonstrates general weakness, decreased activity tolerance, and impaired static and dynamic standing balance. Prior to admission, pt was Mod I without assistive device. Recommend ST rehab at SNF to maximize independence and functional mobility. Daughter present and states plan is for rehab.     PT Assessment  Patient needs continued PT services    Follow Up Recommendations  SNF;Supervision/Assistance - 24 hour    Does the patient have the potential to tolerate intense rehabilitation      Barriers to Discharge        Equipment Recommendations  Rolling walker with 5" wheels    Recommendations for Other Services OT consult   Frequency Min 3X/week    Precautions / Restrictions Precautions Precautions: Fall Precaution Comments: O2 dependent Restrictions Weight Bearing Restrictions: No   Pertinent Vitals/Pain 5/10 R side (ribcage area) with activity At rest: O2 97% 2L, 107 bpm End of  session: O2 97% 2L, 111 bpm Dyspnea 2/4 during session      Mobility  Bed Mobility Bed Mobility: Supine to Sit Supine to Sit: 6: Modified independent (Device/Increase time);HOB elevated;With rails Details for Bed Mobility Assistance: Increased time. Rest break needed after bed mobility before standing.  Transfers Transfers: Sit to Stand;Stand to Sit Sit to Stand: 4: Min assist;From bed;From elevated surface Stand to Sit: 4: Min assist;To chair/3-in-1;With armrests Details for Transfer Assistance: Multiple attempts for pt to rise unassisted-pt unable. Assist to rise, stabilize, control descent. Pt unable to stand statically with UE support.  Ambulation/Gait Ambulation/Gait Assistance: 4: Min assist Ambulation Distance (Feet): 70 Feet Assistive device: Rolling walker Ambulation/Gait Assistance Details: VCs safety, distance from RW, pursed lip breathing. 1 standing rest break needed after ~35 feet. Assist to stabilize throughout ambulation. Ambulated on 2L O2 Gait Pattern: Step-through pattern;Decreased stride length;Decreased step length - right;Decreased step length - left;Trunk flexed    Exercises     PT Diagnosis: Altered mental status;Difficulty walking;Abnormality of gait;Generalized weakness;Acute pain  PT Problem List: Decreased strength;Decreased activity tolerance;Decreased balance;Decreased mobility;Pain;Decreased knowledge of use of DME;Decreased cognition PT Treatment Interventions: DME instruction;Gait training;Functional mobility training;Therapeutic activities;Therapeutic exercise;Patient/family education;Balance training     PT Goals(Current goals can be found in the care plan section) Acute Rehab PT Goals Patient Stated Goal: less pain. regain indepependence. pt agreeable to rehab PT Goal Formulation: With patient/family Time For Goal Achievement: 06/11/13 Potential to Achieve Goals: Good  Visit Information  Last PT Received On: 05/28/13 Assistance Needed:  +1 History of Present Illness: 72 y.o. female with a PMH  of dementia, COPD and chronic respiratory failure on home oxygen who was brought to the hospital with changes in her mental status, hallucinations, delirium type symptoms that have developed over the past 24 hours, including seeing and talking to dead people.  She has also had a 2 day history of more labored breathing in the setting of a recent head cold.  She also has had pain in her legs including cramps in the left calf.  No subjective fever or chills.  Her appetite has been poor. No aggravating or alleviating factors. Her family, who accompanies her here today, reports that she has had similar presentation in the past with pneumonia.       Prior Functioning  Home Living Family/patient expects to be discharged to:: Private residence Living Arrangements: Children Available Help at Discharge: Family Home Access: Stairs to enter Secretary/administrator of Steps: 2 Home Equipment: None Prior Function Level of Independence: Independent Communication Communication: No difficulties    Cognition  Cognition Arousal/Alertness: Awake/alert Behavior During Therapy: WFL for tasks assessed/performed Overall Cognitive Status: History of cognitive impairments - at baseline    Extremity/Trunk Assessment Upper Extremity Assessment Upper Extremity Assessment: Generalized weakness Lower Extremity Assessment Lower Extremity Assessment: Generalized weakness Cervical / Trunk Assessment Cervical / Trunk Assessment: Normal   Balance Balance Balance Assessed: Yes Static Standing Balance Static Standing - Balance Support: Bilateral upper extremity supported Static Standing - Level of Assistance: 4: Min assist Dynamic Standing Balance Dynamic Standing - Balance Support: Bilateral upper extremity supported Dynamic Standing - Level of Assistance: 4: Min assist  End of Session PT - End of Session Equipment Utilized During Treatment: Gait  belt;Oxygen Activity Tolerance: Patient limited by fatigue Patient left: in chair;with call bell/phone within reach;with family/visitor present  GP     Rebeca Alert, MPT Pager: [phone]

## 2013-05-28 NOTE — Progress Notes (Signed)
INITIAL NUTRITION ASSESSMENT  Pt meets criteria for severe MALNUTRITION in the context of chronic illness as evidenced by pt with visible severe muscle wasting and subcutaneous fat loss throughout body.  DOCUMENTATION CODES Per approved criteria  -Severe malnutrition in the context of chronic illness -Underweight   INTERVENTION: - Ensure Complete BID - Encouraged increased PO intake - Will continue to monitor   NUTRITION DIAGNOSIS: Increased nutrient needs related to underweight as evidenced by BMI of 17.   Goal: Pt to consume >90% of meals/supplements  Monitor:  Weights, labs, intake  Reason for Assessment: Consult   72 y.o. female  Admitting Dx: Delirium, acute  ASSESSMENT: Pt admitted with altered mental status with history of dementia, COPD, and chronic respiratory failure. Met with pt who reports eating on/off PTA, no set meal schedule. States she has been losing weight but is unsure how much. Pt cachetic. Pt denies any problems chewing or swallowing. Ate 100% of breakfast. Potassium slightly low, getting oral replacement.   Nutrition Focused Physical Exam:  Subcutaneous Fat:  Orbital Region: severe wasting Upper Arm Region: severe wasting Thoracic and Lumbar Region: NA  Muscle:  Temple Region: severe wasting Clavicle Bone Region: severe wasting Clavicle and Acromion Bone Region:severe wasting Scapular Bone Region: severe wasting Dorsal Hand: severe wasting Patellar Region: NA Anterior Thigh Region: NA Posterior Calf Region: NA  Edema: None noted   Height: Ht Readings from Last 1 Encounters:  05/27/13 5\' 2"  (1.575 m)    Weight: Wt Readings from Last 1 Encounters:  05/27/13 93 lb 7.6 oz (42.4 kg)    Ideal Body Weight: 110 lb  % Ideal Body Weight: 85%  Wt Readings from Last 10 Encounters:  05/27/13 93 lb 7.6 oz (42.4 kg)  02/19/13 96 lb (43.545 kg)  01/03/13 94 lb 12.8 oz (43 kg)  07/02/12 97 lb 9.6 oz (44.271 kg)  03/01/12 95 lb 12.8 oz  (43.455 kg)    Usual Body Weight: 94-96 lb  % Usual Body Weight: 97-99%  BMI:  Body mass index is 17.09 kg/(m^2). Underweight  Estimated Nutritional Needs: Kcal: 1300-1500 Protein: 60-70g Fluid: 1.3-1.5L/day  Skin: Intact   Diet Order: General  EDUCATION NEEDS: -No education needs identified at this time   Intake/Output Summary (Last 24 hours) at 05/28/13 1313 Last data filed at 05/28/13 0910  Gross per 24 hour  Intake    120 ml  Output    375 ml  Net   -255 ml    Last BM: PTA  Labs:   Recent Labs Lab 05/27/13 1342 05/28/13 0935  NA 139 138  K 3.5 3.3*  CL 102 104  CO2 31 27  BUN 8 8  CREATININE 0.74 0.77  CALCIUM 10.3 9.3  GLUCOSE 99 193*    CBG (last 3)  No results found for this basename: GLUCAP,  in the last 72 hours  Scheduled Meds: . ALPRAZolam  0.25 mg Oral QHS  . arformoterol  15 mcg Nebulization BID  . budesonide  0.5 mg Nebulization BID  . enoxaparin (LOVENOX) injection  30 mg Subcutaneous Q24H  . famotidine  20 mg Oral QHS  . folic acid  1 mg Oral q morning - 10a  . levofloxacin  500 mg Oral Daily  . loratadine  10 mg Oral Daily  . memantine  10 mg Oral Daily  . [START ON 06/01/2013] memantine  10 mg Oral BID  . memantine  5 mg Oral QHS  . polyvinyl alcohol  1 drop Right Eye Daily  .  potassium chloride  40 mEq Oral Once  . tiotropium  18 mcg Inhalation q morning - 10a  . [START ON 06/02/2013] Vitamin D (Ergocalciferol)  50,000 Units Oral Q Mon    Continuous Infusions: . sodium chloride 50 mL/hr at 05/27/13 1659    Past Medical History  Diagnosis Date  . COPD (chronic obstructive pulmonary disease)   . 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 H/O ETOH abuse   . Oxygen dependent     2l per nasal cannula  . GERD (gastroesophageal reflux disease)   . Osteopenia   . Arthritis   . Dementia   . Cataracts, bilateral   . Hx of blood clots     Past Surgical History  Procedure Laterality Date  . Hip surgery    . Fracture surgery    .  Cataract extraction w/phaco Right 01/08/2013    Procedure: PHACOEMULSIFICATION, POSTERIOR CHAMBER INTRAOCULAR LENS PLACEMENT;  Surgeon: Shade Flood, MD;  Location: [facility] OR;  Service: Ophthalmology;  Laterality: Right;  ATTEMPTED LOCAL ANESTHESIA; PATIENT RE-PREPPED AND DRAPED FOR GENERAL ANESTHESIA     Levon Hedger MS, RD, LDN [phone] Pager [phone] After Hours Pager

## 2013-05-28 NOTE — Progress Notes (Signed)
05/28/13 1208  PT Time Calculation  PT Start Time 1140  PT Stop Time 1200  PT Time Calculation (min) 20 min  PT G-Codes **NOT FOR INPATIENT CLASS**  Functional Assessment Tool Used (clinical judgement)  Functional Limitation Mobility: Walking and moving around  Mobility: Walking and Moving Around Current Status (F6213) CJ  Mobility: Walking and Moving Around Goal Status (Y8657) CH  PT General Charges  $$ ACUTE PT VISIT 1 Procedure  PT Evaluation  $Initial PT Evaluation Tier I 1 Procedure  PT Treatments  $Gait Training 8-22 mins

## 2013-05-28 NOTE — Progress Notes (Signed)
[facility] PROGRESS NOTE  [name] KVQ:[id number] DOB: January 12, 1941 DOA: 05/27/2013 PCP: Dorrene German, MD  Assessment/Plan: 1-Delirium, acute /Encephalopathy:  -May be from occult pneumonia or UTI.  -Leukocytes in urine but no nitrites. Followup cultures.  -Continue with Levaquin day 2.  -Haldol as needed for agitation/hallucinations.    2-COPD (chronic obstructive pulmonary disease)Gold D  -No evidence of acute exacerbation at present.  -Continue supplemental oxygen.  -Continue usual home medications including Brovana, Pulmicort, Spiriva and albuterol.   3-Dementia  -May need skilled nursing home placement.  -PT/OT, social work consulted.  -Continue with namenda.   4-Chronic respiratory failure  -Continue supplemental oxygen.   5-Fever  -May be from occult UTI or developing pneumonia. On empiric Levaquin.   6-Severe protein-calorie malnutrition  Ensure BID.    Code Status: Full Family Communication: care discussed with daughter over phone.  Disposition Plan: will benefit of SNF placement,. SNF when bed available.    Consultants:  none  Procedures:  none  Antibiotics:    HPI/Subjective: Patient calm, oriented to place. No complaints.   Objective: Filed Vitals:   05/28/13 0512  BP: 99/48  Pulse: 81  Temp: 99.7 F (37.6 C)  Resp: 16    Intake/Output Summary (Last 24 hours) at 05/28/13 1135 Last data filed at 05/28/13 0910  Gross per 24 hour  Intake    120 ml  Output    375 ml  Net   -255 ml   Filed Weights   05/27/13 1642  Weight: 42.4 kg (93 lb 7.6 oz)    Exam:   General:  No distress. Oriented to person.   Cardiovascular: S 1, S 2 RRR  Respiratory: no wheezes, mild ronchus  Abdomen: Bs present, soft, NT  Musculoskeletal: no edema.   Data Reviewed: Basic Metabolic Panel:  Recent Labs Lab 05/27/13 1342 05/28/13 0935  NA 139 138  K 3.5 3.3*  CL 102 104  CO2 31 27  GLUCOSE 99 193*  BUN 8 8  CREATININE 0.74  0.77  CALCIUM 10.3 9.3   Liver Function Tests:  Recent Labs Lab 05/27/13 1342  AST 18  ALT 11  ALKPHOS 79  BILITOT 1.0  PROT 7.2  ALBUMIN 3.7   No results found for this basename: LIPASE, AMYLASE,  in the last 168 hours  Recent Labs Lab 05/27/13 1342  AMMONIA 11   CBC:  Recent Labs Lab 05/27/13 1342 05/28/13 0935  WBC 8.1 6.8  HGB 10.7* 9.3*  HCT 34.0* 30.3*  MCV 74.7* 75.2*  PLT 161 140*   Cardiac Enzymes: No results found for this basename: CKTOTAL, CKMB, CKMBINDEX, TROPONINI,  in the last 168 hours BNP (last 3 results) No results found for this basename: PROBNP,  in the last 8760 hours CBG: No results found for this basename: GLUCAP,  in the last 168 hours  Recent Results (from the past 240 hour(s))  MRSA PCR SCREENING     Status: None   Collection Time    05/27/13  8:44 PM      Result Value Range Status   MRSA by PCR NEGATIVE  NEGATIVE Final   Comment:            The GeneXpert MRSA Assay (FDA     approved for NASAL specimens     only), is one component of a     comprehensive MRSA colonization     surveillance program. It is not     intended to diagnose MRSA     infection nor  to guide or     monitor treatment for     MRSA infections.     Studies: Dg Chest 2 View  05/27/2013   *RADIOLOGY REPORT*  Clinical Data: Altered mental status, cough, COPD  CHEST - 2 VIEW  Comparison: Prior chest x-ray 03/01/2012  Findings: Stable appearance of the lungs with severe emphysematous and bronchitic changes.  Prominent left nipple shadow is similar to prior.  Cardiac and mediastinal contours remain within normal limits.  No pleural effusion or pneumothorax.  No edema.  Diffuse osteopenia.  No acute osseous abnormality.  IMPRESSION:  Stable chest x-ray with changes of advanced COPD/emphysema.  No acute cardiopulmonary disease.   Original Report Authenticated By: Malachy Moan, M.D.   Ct Head Wo Contrast  05/27/2013   *RADIOLOGY REPORT*  Clinical Data: Altered mental  status.  Confusion. Dementia.  CT HEAD WITHOUT CONTRAST  Technique:  Contiguous axial images were obtained from the base of the skull through the vertex without contrast.  Comparison: None.  Findings: No intracranial hemorrhage.  No CT evidence of large acute infarct.  No intracranial mass lesion detected on this unenhanced exam.  Mild age related atrophy without hydrocephalus  Mastoid air cells, and middle ear cavities and visualized paranasal sinuses are clear.  IMPRESSION: No acute abnormality.  Please see above   Original Report Authenticated By: Lacy Duverney, M.D.    Scheduled Meds: . ALPRAZolam  0.25 mg Oral QHS  . arformoterol  15 mcg Nebulization BID  . budesonide  0.5 mg Nebulization BID  . enoxaparin (LOVENOX) injection  30 mg Subcutaneous Q24H  . famotidine  20 mg Oral QHS  . folic acid  1 mg Oral q morning - 10a  . levofloxacin  500 mg Oral Daily  . loratadine  10 mg Oral Daily  . memantine  10 mg Oral Daily  . [START ON 06/01/2013] memantine  10 mg Oral BID  . memantine  5 mg Oral QHS  . polyvinyl alcohol  1 drop Right Eye Daily  . potassium chloride  40 mEq Oral Once  . tiotropium  18 mcg Inhalation q morning - 10a  . [START ON 06/02/2013] Vitamin D (Ergocalciferol)  50,000 Units Oral Q Mon   Continuous Infusions: . 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     [name],[name]  [facility] Pager [phone]. If 7PM-7AM, please contact night-coverage at [email or web address], password [facility] 05/28/2013, 11:35 AM  LOS: 1 day

## 2013-05-29 LAB — BASIC METABOLIC PANEL
BUN: 6 mg/dL (ref 6–23)
CO2: 28 mEq/L (ref 19–32)
Glucose, Bld: 92 mg/dL (ref 70–99)
Potassium: 3.7 mEq/L (ref 3.5–5.1)
Sodium: 140 mEq/L (ref 135–145)

## 2013-05-29 LAB — URINE CULTURE: Culture: NO GROWTH

## 2013-05-29 MED ORDER — ALPRAZOLAM 0.25 MG PO TABS: 0.2500 mg | ORAL_TABLET | Freq: Every day | ORAL | Status: DC

## 2013-05-29 MED ORDER — ENSURE COMPLETE PO LIQD: 237.0000 mL | Freq: Two times a day (BID) | ORAL | Status: DC

## 2013-05-29 MED ORDER — LEVOFLOXACIN 500 MG PO TABS: 500.0000 mg | ORAL_TABLET | Freq: Every day | ORAL | Status: DC

## 2013-05-29 NOTE — Progress Notes (Signed)
Called Report to [facility], report given to RN on pt's unit. PIV removed, WNL. No change in pt condition since AM assessment. Pt d/c'd to [facility] via daughter. Eugene Garnet RN

## 2013-05-29 NOTE — Progress Notes (Addendum)
CSW met with patient and patients daughter at bedside. Daughter very glad that patient has bed at [facility]. Daughter would like to transport patient.  Packet copied and placed in [place].  Neco Kling C. Arlean Thies MSW, LCSW [phone]

## 2013-05-29 NOTE — Progress Notes (Signed)
OT Cancellation Note  Patient Details Name: [name] MRN: [id number] DOB: [birth year]   Cancelled Treatment:    Reason Eval/Treat Not Completed: Other (comment) (will defer to SNF. supposed to possibly d/c today)  Lennox Laity [phone] 05/29/2013, 12:05 PM

## 2013-05-29 NOTE — Discharge Summary (Signed)
Physician Discharge Summary  [name] ZOX:[id number] DOB: Dec 28, 1940 DOA: 05/27/2013  PCP: Dorrene German, MD  Admit date: 05/27/2013 Discharge date: 05/29/2013  Time spent: 35 minutes  Recommendations for Outpatient Follow-up:  1. Needs repeated B-met to follow electrolytes.   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   Diet recommendation: Heart Healthy.   Filed Weights   05/27/13 1642  Weight: 42.4 kg (93 lb 7.6 oz)    History of present illness:  [name] is an 72 y.o. female with a PMH of dementia, COPD and chronic respiratory failure on home oxygen who was brought to the hospital with changes in her mental status, hallucinations, delirium type symptoms that have developed over the past 24 hours, including seeing and talking to dead people. She has also had a 2 day history of more labored breathing in the setting of a recent head cold. She also has had pain in her legs including cramps in the left calf. No subjective fever or chills. Her appetite has been poor. No aggravating or alleviating factors. Her family, who accompanies her here today, reports that she has had similar presentation in the past with pneumonia.   Hospital Course:  1-Delirium, acute /Encephalopathy:  -Improved.  -May be from occult pneumonia or UTI.  -Leukocytes in urine but no nitrites. Urine  Cultures no growth to date.  -Continue with Levaquin day 2.  -Haldol as needed for agitation/hallucinations but will need to monitor QT while on Levaquin. Patient has not required haldol.   2-COPD (chronic obstructive pulmonary disease)Gold D  -No evidence of acute exacerbation at present.  -Continue supplemental oxygen.  -Continue usual home medications including Brovana, Pulmicort, Spiriva and albuterol.   3-Dementia  -May need skilled nursing home placement.  -PT/OT, social work consulted.  -Continue with namenda.  4-Chronic respiratory failure  -Continue supplemental oxygen.  5-Fever  -May be from occult UTI or developing pneumonia. On empiric Levaquin. Resolved.  6-Severe protein-calorie malnutrition  Ensure BID.    Procedures:  none  Consultations:  none  Discharge Exam: Filed Vitals:   05/29/13 0600  BP: 94/59  Pulse: 96  Temp: 97.8 F (36.6 C)  Resp: 16    General: No distress.  Cardiovascular: S 1, S 2 RRR Respiratory: CTA  Discharge Instructions  Discharge Orders   Future Orders Complete By Expires     Diet - low sodium heart healthy  As directed     Increase activity slowly  As directed         Medication List         albuterol (2.5 MG/3ML) 0.083% nebulizer solution  Commonly known as:  PROVENTIL  Take 2.5 mg by nebulization every 6 (six) hours as needed for shortness of breath.     PROAIR HFA 108 (90 BASE) MCG/ACT inhaler  Generic drug:  albuterol  Inhale 2 puffs into the lungs every 6 (six) hours as needed for shortness of breath.     ALPRAZolam 0.25 MG tablet  Commonly known as:  XANAX  Take 0.25 mg by mouth at bedtime.     arformoterol 15 MCG/2ML Nebu  Commonly known as:  BROVANA  Take 15 mcg by nebulization 2 (two) times daily.     benzonatate 100 MG capsule  Commonly known as:  TESSALON  Take 100-200 mg by  mouth every 8 (eight) hours as needed for cough.     budesonide 0.5 MG/2ML nebulizer solution  Commonly known as:  PULMICORT  Take 0.5 mg by nebulization 2 (two) times daily.     cetirizine 10 MG tablet  Commonly known as:  ZYRTEC  Take 10 mg by mouth every morning.     diclofenac 50 MG EC tablet  Commonly known as:  VOLTAREN  Take 50 mg by mouth 2 (two) times daily as needed (For knee pain.).     famotidine 20 MG tablet  Commonly known as:  PEPCID  Take 20 mg by mouth at bedtime.     feeding supplement Liqd  Take 237  mLs by mouth 2 (two) times daily between meals.     folic acid 1 MG tablet  Commonly known as:  FOLVITE  Take 1 mg by mouth every morning.     levofloxacin 500 MG tablet  Commonly known as:  LEVAQUIN  Take 1 tablet (500 mg total) by mouth daily.     memantine tablet pack  Commonly known as:  NAMENDA TITRATION PACK  Take by mouth See admin instructions. 5 mg/day for =1 week; 5 mg twice daily for =1 week; 15 mg/day given in 5 mg and 10 mg separated doses for =1 week; then 10 mg twice daily     SYSTANE OP  Place 1 drop into the right eye daily as needed (for dry eyes).     tiotropium 18 MCG inhalation capsule  Commonly known as:  SPIRIVA  Place 18 mcg into inhaler and inhale every morning.     Vitamin D (Ergocalciferol) 50000 UNITS Caps capsule  Commonly known as:  DRISDOL  Take 50,000 Units by mouth every Monday.       No Known Allergies    The results of significant diagnostics from this hospitalization (including imaging, microbiology, ancillary and laboratory) are listed below for reference.    Significant Diagnostic Studies: Dg Chest 2 View  05/27/2013   *RADIOLOGY REPORT*  Clinical Data: Altered mental status, cough, COPD  CHEST - 2 VIEW  Comparison: Prior chest x-ray 03/01/2012  Findings: Stable appearance of the lungs with severe emphysematous and bronchitic changes.  Prominent left nipple shadow is similar to prior.  Cardiac and mediastinal contours remain within normal limits.  No pleural effusion or pneumothorax.  No edema.  Diffuse osteopenia.  No acute osseous abnormality.  IMPRESSION:  Stable chest x-ray with changes of advanced COPD/emphysema.  No acute cardiopulmonary disease.   Original Report Authenticated By: Malachy Moan, M.D.   Ct Head Wo Contrast  05/27/2013   *RADIOLOGY REPORT*  Clinical Data: Altered mental status.  Confusion. Dementia.  CT HEAD WITHOUT CONTRAST  Technique:  Contiguous axial images were obtained from the base of the skull through the vertex  without contrast.  Comparison: None.  Findings: No intracranial hemorrhage.  No CT evidence of large acute infarct.  No intracranial mass lesion detected on this unenhanced exam.  Mild age related atrophy without hydrocephalus  Mastoid air cells, and middle ear cavities and visualized paranasal sinuses are clear.  IMPRESSION: No acute abnormality.  Please see above   Original Report Authenticated By: Lacy Duverney, M.D.    Microbiology: Recent Results (from the past 240 hour(s))  URINE CULTURE     Status: None   Collection Time    05/27/13  2:58 PM      Result Value Range Status   Specimen Description URINE, RANDOM   Final   Special  Requests NONE   Final   Culture  Setup Time     Final   Value: 05/28/2013 04:08     Performed at [facility] Count     Final   Value: NO GROWTH     Performed at [facility]   Culture     Final   Value: NO GROWTH     Performed at [facility]   Report Status 05/29/2013 FINAL   Final  MRSA PCR SCREENING     Status: None   Collection Time    05/27/13  8:44 PM      Result Value Range Status   MRSA by PCR NEGATIVE  NEGATIVE Final   Comment:            The GeneXpert MRSA Assay (FDA     approved for NASAL specimens     only), is one component of a     comprehensive MRSA colonization     surveillance program. It is not     intended to diagnose MRSA     infection nor to guide or     monitor treatment for     MRSA infections.     Labs: Basic Metabolic Panel:  Recent Labs Lab 05/27/13 1342 05/28/13 0935 05/29/13 0453  NA 139 138 140  K 3.5 3.3* 3.7  CL 102 104 108  CO2 31 27 28   GLUCOSE 99 193* 92  BUN 8 8 6   CREATININE 0.74 0.77 0.66  CALCIUM 10.3 9.3 9.3   Liver Function Tests:  Recent Labs Lab 05/27/13 1342  AST 18  ALT 11  ALKPHOS 79  BILITOT 1.0  PROT 7.2  ALBUMIN 3.7   No results found for this basename: LIPASE, AMYLASE,  in the last 168 hours  Recent Labs Lab 05/27/13 1342  AMMONIA 11    CBC:  Recent Labs Lab 05/27/13 1342 05/28/13 0935  WBC 8.1 6.8  HGB 10.7* 9.3*  HCT 34.0* 30.3*  MCV 74.7* 75.2*  PLT 161 140*   Cardiac Enzymes: No results found for this basename: CKTOTAL, CKMB, CKMBINDEX, TROPONINI,  in the last 168 hours BNP: BNP (last 3 results) No results found for this basename: PROBNP,  in the last 8760 hours CBG: No results found for this basename: GLUCAP,  in the last 168 hours     Signed:  [name],[name]  [facility] 05/29/2013, 10:52 AM

## 2013-05-29 NOTE — Clinical Social Work Placement (Signed)
     Clinical Social Work Department CLINICAL SOCIAL WORK PLACEMENT NOTE 05/29/2013  Patient:  [name], [name]  Account Number:  [id number] Admit date:  05/27/2013  Clinical Social Worker:  Becky Sax, LCSW  Date/time:  05/29/2013 12:00 M  Clinical Social Work is seeking post-discharge placement for this patient at the following level of care:   SKILLED NURSING   (*CSW will update this form in Epic as items are completed)   05/29/2013  Patient/family provided with [facility] Department of Clinical Social Works list of facilities offering this level of care within the geographic area requested by the patient (or if unable, by the patients family).  05/29/2013  Patient/family informed of their freedom to choose among providers that offer the needed level of care, that participate in Medicare, Medicaid or managed care program needed by the patient, have an available bed and are willing to accept the patient.  05/29/2013  Patient/family informed of [facility] ownership interest in [facility], as well as of the fact that they are under no obligation to receive care at this facility.  PASARR submitted to EDS on 05/29/2013 PASARR number received from [employer] on 05/29/2013  FL2 transmitted to all facilities in geographic area requested by pt/family on  05/29/2013 FL2 transmitted to all facilities within larger geographic area on   Patient informed that his/her managed care company has contracts with or will negotiate with  certain facilities, including the following:     Patient/family informed of bed offers received:  05/29/2013 [facility]    Patient to be transferred to [facility] on  05/29/2013 Patient to be transferred to facility by family  The following physician request were entered in Epic:   Additional Comments:

## 2013-08-27 ENCOUNTER — Ambulatory Visit (INDEPENDENT_AMBULATORY_CARE_PROVIDER_SITE_OTHER): Payer: Medicare Other | Admitting: Critical Care Medicine

## 2013-08-27 ENCOUNTER — Encounter: Payer: Self-pay | Admitting: Critical Care Medicine

## 2013-08-27 VITALS — BP 108/66 | HR 91 | Temp 97.6°F | Ht 64.0 in | Wt 91.4 lb

## 2013-08-27 DIAGNOSIS — J4489 Other specified chronic obstructive pulmonary disease: Secondary | ICD-10-CM

## 2013-08-27 DIAGNOSIS — J449 Chronic obstructive pulmonary disease, unspecified: Secondary | ICD-10-CM

## 2013-08-27 DIAGNOSIS — J9610 Chronic respiratory failure, unspecified whether with hypoxia or hypercapnia: Secondary | ICD-10-CM

## 2013-08-27 NOTE — Assessment & Plan Note (Signed)
Gold stage D. COPD oxygen dependent with chronic respiratory failure stable at this time Plan Maintain oxygen as prescribed Maintain nebulized therapy as prescribed

## 2013-08-27 NOTE — Assessment & Plan Note (Signed)
Chronic respiratory failure oxygen dependent  Maintain oxygen settings as prescribed

## 2013-08-27 NOTE — Progress Notes (Signed)
Subjective:    Patient ID: [name], female    DOB: September 14, 1941, 72 y.o.   MRN: [id number]  HPI 08/27/2013 Chief Complaint  Patient presents with  . 6 month follow up    Breathing is a little better since last OV.  No c/o of SOB, wheezing, chest tightness, chest pain, or cough at this time.  Breathing is better.  No real cough now.  No chest pain Pt was in hosp 05/2013, delirium issues.  Now is better Issues with diet, weight loss  Pt denies any significant sore throat, nasal congestion or excess secretions, fever, chills, sweats, unintended weight loss, pleurtic or exertional chest pain, orthopnea PND, or leg swelling Pt denies any increase in rescue therapy over baseline, denies waking up needing it or having any early am or nocturnal exacerbations of coughing/wheezing/or dyspnea. Pt also denies any obvious fluctuation in symptoms with  weather or environmental change or other alleviating or aggravating factors    Past Medical History  Diagnosis Date  . COPD (chronic obstructive pulmonary disease)   . 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 H/O ETOH abuse   . Oxygen dependent     2l per nasal cannula  . GERD (gastroesophageal reflux disease)   . Osteopenia   . Arthritis   . Dementia   . Cataracts, bilateral   . Hx of blood clots      Family History  Problem Relation Age of Onset  . Emphysema Sister     smoker  . Asthma Daughter   . Heart disease Father   . Deep vein thrombosis Daughter      History   Social History  . Marital Status: Widowed    Spouse Name: N/A    Number of Children: 6  . Years of Education: 10   Occupational History  . [employer], retired.    Social History Main Topics  . Smoking status: Former Smoker -- 0.50 packs/day for 50 years    Types: Cigarettes    Quit date: 02/21/2011  . Smokeless tobacco: Never Used  . Alcohol Use: No     Comment: H/O abuse.  None now.  . Drug Use: No  . Sexual Activity: Not on file   Other Topics Concern  . Not on  file   Social History Narrative   Widowed.  Lives with daughter Babette Relic.  Ambulates without assistance at [place].     No Known Allergies   Outpatient Prescriptions Prior to Visit  Medication Sig Dispense Refill  . albuterol (PROAIR HFA) 108 (90 BASE) MCG/ACT inhaler Inhale 2 puffs into the lungs every 6 (six) hours as needed for shortness of breath.       Marland Kitchen albuterol (PROVENTIL) (2.5 MG/3ML) 0.083% nebulizer solution Take 2.5 mg by nebulization every 6 (six) hours as needed for shortness of breath.       . ALPRAZolam (XANAX) 0.25 MG tablet Take 1 tablet (0.25 mg total) by mouth at bedtime.  30 tablet  0  . arformoterol (BROVANA) 15 MCG/2ML NEBU Take 15 mcg by nebulization 2 (two) times daily.        . benzonatate (TESSALON) 100 MG capsule Take 100-200 mg by mouth every 8 (eight) hours as needed for cough.       . budesonide (PULMICORT) 0.5 MG/2ML nebulizer solution Take 0.5 mg by nebulization 2 (two) times daily.       . cetirizine (ZYRTEC) 10 MG tablet Take 10 mg by mouth every morning.       Marland Kitchen  diclofenac (VOLTAREN) 50 MG EC tablet Take 50 mg by mouth 2 (two) times daily as needed (For knee pain.).      Marland Kitchen famotidine (PEPCID) 20 MG tablet Take 20 mg by mouth at bedtime.       . feeding supplement (ENSURE COMPLETE) LIQD Take 237 mLs by mouth 2 (two) times daily between meals.  30 Bottle  0  . folic acid (FOLVITE) 1 MG tablet Take 1 mg by mouth every morning.       . memantine (NAMENDA TITRATION PACK) tablet pack Take by mouth See admin instructions. 5 mg/day for =1 week; 5 mg twice daily for =1 week; 15 mg/day given in 5 mg and 10 mg separated doses for =1 week; then 10 mg twice daily      . Polyethyl Glycol-Propyl Glycol (SYSTANE OP) Place 1 drop into the right eye daily as needed (for dry eyes).       Marland Kitchen tiotropium (SPIRIVA) 18 MCG inhalation capsule Place 18 mcg into inhaler and inhale every morning.       . Vitamin D, Ergocalciferol, (DRISDOL) 50000 UNITS CAPS Take 50,000 Units by mouth  every Monday.       . levofloxacin (LEVAQUIN) 500 MG tablet Take 1 tablet (500 mg total) by mouth daily.  5 tablet  0   No facility-administered medications prior to visit.       Review of Systems  Constitutional: Positive for appetite change and unexpected weight change. Negative for chills.  HENT: Positive for congestion and dental problem. Negative for nosebleeds, postnasal drip, sinus pressure, sneezing, trouble swallowing and voice change.   Eyes: Negative for visual disturbance.  Respiratory: Positive for cough. Negative for choking.   Gastrointestinal: Negative for diarrhea.  Genitourinary: Negative for difficulty urinating.  Musculoskeletal: Positive for arthralgias.  Neurological: Negative for tremors and syncope.  Hematological: Does not bruise/bleed easily.       Objective:   Physical Exam   Filed Vitals:   08/27/13 1010  BP: 108/66  Pulse: 91  Temp: 97.6 F (36.4 C)  TempSrc: Oral  Height: 5\' 4"  (1.626 m)  Weight: 91 lb 6.4 oz (41.459 kg)  SpO2: 97%    Gen: Thin African American female in no distress  ENT: No lesions,  mouth clear,  oropharynx clear, no postnasal drip  Neck: No JVD, no TMG, no carotid bruits  Lungs: No use of accessory muscles, no dullness to percussion, distant breath sounds  Cardiovascular: RRR, heart sounds normal, no murmur or gallops, no peripheral edema  Abdomen: soft and NT, no HSM,  BS normal  Musculoskeletal: No deformities, no cyanosis or clubbing  Neuro: alert, non focal  Skin: Warm, no lesions or rashes      Assessment & Plan:   COPD (chronic obstructive pulmonary disease)Gold D Gold stage D. COPD oxygen dependent with chronic respiratory failure stable at this time Plan Maintain oxygen as prescribed Maintain nebulized therapy as prescribed  Chronic respiratory failure Chronic respiratory failure oxygen dependent  Maintain oxygen settings as prescribed    Updated Medication List Outpatient Encounter  Prescriptions as of 08/27/2013  Medication Sig  . albuterol (PROAIR HFA) 108 (90 BASE) MCG/ACT inhaler Inhale 2 puffs into the lungs every 6 (six) hours as needed for shortness of breath.   Marland Kitchen albuterol (PROVENTIL) (2.5 MG/3ML) 0.083% nebulizer solution Take 2.5 mg by nebulization every 6 (six) hours as needed for shortness of breath.   . ALPRAZolam (XANAX) 0.25 MG tablet Take 1 tablet (0.25 mg total) by mouth  at bedtime.  Marland Kitchen arformoterol (BROVANA) 15 MCG/2ML NEBU Take 15 mcg by nebulization 2 (two) times daily.    . benzonatate (TESSALON) 100 MG capsule Take 100-200 mg by mouth every 8 (eight) hours as needed for cough.   . budesonide (PULMICORT) 0.5 MG/2ML nebulizer solution Take 0.5 mg by nebulization 2 (two) times daily.   . cetirizine (ZYRTEC) 10 MG tablet Take 10 mg by mouth every morning.   . diclofenac (VOLTAREN) 50 MG EC tablet Take 50 mg by mouth 2 (two) times daily as needed (For knee pain.).  Marland Kitchen famotidine (PEPCID) 20 MG tablet Take 20 mg by mouth at bedtime.   . feeding supplement (ENSURE COMPLETE) LIQD Take 237 mLs by mouth 2 (two) times daily between meals.  . folic acid (FOLVITE) 1 MG tablet Take 1 mg by mouth every morning.   . memantine (NAMENDA TITRATION PACK) tablet pack Take by mouth See admin instructions. 5 mg/day for =1 week; 5 mg twice daily for =1 week; 15 mg/day given in 5 mg and 10 mg separated doses for =1 week; then 10 mg twice daily  . Polyethyl Glycol-Propyl Glycol (SYSTANE OP) Place 1 drop into the right eye daily as needed (for dry eyes).   Marland Kitchen tiotropium (SPIRIVA) 18 MCG inhalation capsule Place 18 mcg into inhaler and inhale every morning.   . Vitamin D, Ergocalciferol, (DRISDOL) 50000 UNITS CAPS Take 50,000 Units by mouth every Monday.   . [DISCONTINUED] levofloxacin (LEVAQUIN) 500 MG tablet Take 1 tablet (500 mg total) by mouth daily.

## 2013-08-27 NOTE — Patient Instructions (Signed)
Consider using Resource a protein based supplement twice daily No change in inhaled medications Return 4 months

## 2014-03-11 ENCOUNTER — Other Ambulatory Visit: Payer: Self-pay

## 2014-03-11 DIAGNOSIS — Z1231 Encounter for screening mammogram for malignant neoplasm of breast: Secondary | ICD-10-CM

## 2014-03-19 ENCOUNTER — Emergency Department (HOSPITAL_COMMUNITY)
Admission: EM | Admit: 2014-03-19 | Discharge: 2014-03-19 | Disposition: A | Discharge status: Home / Self Care | Payer: Medicare Other | Attending: Emergency Medicine | Admitting: Emergency Medicine

## 2014-03-19 ENCOUNTER — Emergency Department (HOSPITAL_COMMUNITY): Payer: Medicare Other

## 2014-03-19 ENCOUNTER — Encounter (HOSPITAL_COMMUNITY): Payer: Self-pay | Admitting: Emergency Medicine

## 2014-03-19 DIAGNOSIS — Z8679 Personal history of other diseases of the circulatory system: Secondary | ICD-10-CM | POA: Insufficient documentation

## 2014-03-19 DIAGNOSIS — Z87891 Personal history of nicotine dependence: Secondary | ICD-10-CM | POA: Insufficient documentation

## 2014-03-19 DIAGNOSIS — K219 Gastro-esophageal reflux disease without esophagitis: Secondary | ICD-10-CM | POA: Insufficient documentation

## 2014-03-19 DIAGNOSIS — Z9981 Dependence on supplemental oxygen: Secondary | ICD-10-CM | POA: Insufficient documentation

## 2014-03-19 DIAGNOSIS — F1027 Alcohol dependence with alcohol-induced persisting dementia: Secondary | ICD-10-CM | POA: Insufficient documentation

## 2014-03-19 DIAGNOSIS — J449 Chronic obstructive pulmonary disease, unspecified: Secondary | ICD-10-CM | POA: Insufficient documentation

## 2014-03-19 DIAGNOSIS — M129 Arthropathy, unspecified: Secondary | ICD-10-CM | POA: Insufficient documentation

## 2014-03-19 DIAGNOSIS — M25559 Pain in unspecified hip: Secondary | ICD-10-CM | POA: Insufficient documentation

## 2014-03-19 DIAGNOSIS — Z9889 Other specified postprocedural states: Secondary | ICD-10-CM | POA: Insufficient documentation

## 2014-03-19 DIAGNOSIS — J4489 Other specified chronic obstructive pulmonary disease: Secondary | ICD-10-CM | POA: Insufficient documentation

## 2014-03-19 DIAGNOSIS — Z79899 Other long term (current) drug therapy: Secondary | ICD-10-CM | POA: Insufficient documentation

## 2014-03-19 DIAGNOSIS — Z8669 Personal history of other diseases of the nervous system and sense organs: Secondary | ICD-10-CM | POA: Insufficient documentation

## 2014-03-19 MED ORDER — HYDROCODONE-ACETAMINOPHEN 5-325 MG PO TABS
1.0000 | ORAL_TABLET | Freq: Once | ORAL | Status: AC
  Administered 2014-03-19: 1 via ORAL
  Filled 2014-03-19: qty 1

## 2014-03-19 MED ORDER — HYDROCODONE-ACETAMINOPHEN 5-325 MG PO TABS: 2.0000 | ORAL_TABLET | ORAL | Status: DC | PRN

## 2014-03-19 NOTE — ED Notes (Signed)
Per pt/family-B/L hip pain for a week-was seen by PCP and was diagnosed with arthritis-meds not working and having increased pain

## 2014-03-19 NOTE — ED Provider Notes (Signed)
CSN: [id number]     Arrival date & time 03/19/14  1532 History   First MD Initiated Contact with Patient 03/19/14 1656     Chief Complaint  Patient presents with  . Hip Pain      HPI  Patient presents with family complaining of bilateral hip pain for the last several days.  Primary care given meloxicam. Not getting any improvement. He still ambulatory but complains of pain. No falls. Previous right hip surgery.  Other areas of joint pain.  Past Medical History  Diagnosis Date  . COPD (chronic obstructive pulmonary disease)   . 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 H/O ETOH abuse   . Oxygen dependent     2l per nasal cannula  . GERD (gastroesophageal reflux disease)   . Osteopenia   . Arthritis   . Dementia   . Cataracts, bilateral   . Hx of blood clots    Past Surgical History  Procedure Laterality Date  . Hip surgery    . Fracture surgery    . Cataract extraction w/phaco Right 01/08/2013    Procedure: PHACOEMULSIFICATION, POSTERIOR CHAMBER INTRAOCULAR LENS PLACEMENT;  Surgeon: Shade Flood, MD;  Location: [facility] OR;  Service: Ophthalmology;  Laterality: Right;  ATTEMPTED LOCAL ANESTHESIA; PATIENT RE-PREPPED AND DRAPED FOR GENERAL ANESTHESIA   Family History  Problem Relation Age of Onset  . Emphysema Sister     smoker  . Asthma Daughter   . Heart disease Father   . Deep vein thrombosis Daughter    History  Substance Use Topics  . Smoking status: Former Smoker -- 0.50 packs/day for 50 years    Types: Cigarettes    Quit date: 02/21/2011  . Smokeless tobacco: Never Used  . Alcohol Use: No     Comment: H/O abuse.  None now.   OB History   Grav Para Term Preterm Abortions TAB SAB Ect Mult Living                 Review of Systems  Unable to perform ROS: Dementia      Allergies  Review of patient's allergies indicates no known allergies.  Home Medications   Prior to Admission medications   Medication Sig Start Date End Date Taking? Authorizing Provider  albuterol  (PROAIR HFA) 108 (90 BASE) MCG/ACT inhaler Inhale 2 puffs into the lungs every 6 (six) hours as needed for shortness of breath.     Historical Provider, MD  albuterol (PROVENTIL) (2.5 MG/3ML) 0.083% nebulizer solution Take 2.5 mg by nebulization every 6 (six) hours as needed for shortness of breath.     Historical Provider, MD  ALPRAZolam Prudy Feeler) 0.25 MG tablet Take 1 tablet (0.25 mg total) by mouth at bedtime. 05/29/13   [name] A [name], MD  arformoterol (BROVANA) 15 MCG/2ML NEBU Take 15 mcg by nebulization 2 (two) times daily.      Historical Provider, MD  benzonatate (TESSALON) 100 MG capsule Take 100-200 mg by mouth every 8 (eight) hours as needed for cough.     Historical Provider, MD  budesonide (PULMICORT) 0.5 MG/2ML nebulizer solution Take 0.5 mg by nebulization 2 (two) times daily.     Historical Provider, MD  cetirizine (ZYRTEC) 10 MG tablet Take 10 mg by mouth every morning.     Historical Provider, MD  diclofenac (VOLTAREN) 50 MG EC tablet Take 50 mg by mouth 2 (two) times daily as needed (For knee pain.).    Historical Provider, MD  famotidine (PEPCID) 20 MG tablet Take 20 mg  by mouth at bedtime.     Historical Provider, MD  feeding supplement (ENSURE COMPLETE) LIQD Take 237 mLs by mouth 2 (two) times daily between meals. 05/29/13   [name] A [name], MD  folic acid (FOLVITE) 1 MG tablet Take 1 mg by mouth every morning.     Historical Provider, MD  HYDROcodone-acetaminophen (NORCO/VICODIN) 5-325 MG per tablet Take 2 tablets by mouth every 4 (four) hours as needed. 03/19/14   Rolland PorterMark Emilene Roma, MD  memantine [facility](NAMENDA TITRATION PACK) tablet pack Take by mouth See admin instructions. 5 mg/day for =1 week; 5 mg twice daily for =1 week; 15 mg/day given in 5 mg and 10 mg separated doses for =1 week; then 10 mg twice daily    Historical Provider, MD  Polyethyl Glycol-Propyl Glycol (SYSTANE OP) Place 1 drop into the right eye daily as needed (for dry eyes).     Historical Provider, MD  tiotropium (SPIRIVA)  18 MCG inhalation capsule Place 18 mcg into inhaler and inhale every morning.     Historical Provider, MD  Vitamin D, Ergocalciferol, (DRISDOL) 50000 UNITS CAPS Take 50,000 Units by mouth every Monday.     Historical Provider, MD   BP 92/54  Pulse 90  Temp(Src) 98.2 F (36.8 C) (Oral)  Resp 18  SpO2 100% Physical Exam  Constitutional: She appears well-developed and well-nourished. No distress.  HENT:  Head: Normocephalic.  Eyes: Conjunctivae are normal. Pupils are equal, round, and reactive to light. No scleral icterus.  Neck: Normal range of motion. Neck supple. No thyromegaly present.  Cardiovascular: Normal rate and regular rhythm.  Exam reveals no gallop and no friction rub.   No murmur heard. Pulmonary/Chest: Effort normal and breath sounds normal. No respiratory distress. She has no wheezes. She has no rales.  Abdominal: Soft. Bowel sounds are normal. She exhibits no distension. There is no tenderness. There is no rebound.  Musculoskeletal: Normal range of motion.  Complains of pain with movement of bilateral hips. No foreshortening or rotation.  Is able to flex at the hip fully. She complains of pain and is able to do so without apparent difficulty.  Neurological: She is alert.  Skin: Skin is warm and dry. No rash noted.  Psychiatric: She has a normal mood and affect. Her behavior is normal.    ED Course  Procedures (including critical care time) Labs Review Labs Reviewed - No data to display  Imaging Review Dg Pelvis 1-2 Views  03/19/2014   CLINICAL DATA:  73 year old with dementia. Complaining of right hip pain this week, no known injury.  EXAM: PELVIS - 1-2 VIEW  COMPARISON:  Prior pelvic and right femur radiographs 02/19/2009  FINDINGS: Stable postsurgical changes of intra medullary nail and cannulated femoral neck screw repair of a now healed intertrochanteric right femoral fracture. No evidence of hardware complication, periprosthetic fracture or new heterotopic  ossification. The bones are osteopenic. The pelvis appears intact. The left hip is unremarkable. Trace atherosclerotic vascular calcifications.  IMPRESSION: 1. No acute fracture or malalignment. 2. Surgical changes of ORIF of a now healed right intertrochanteric fracture without evidence of complication.   Electronically Signed   By: Malachy MoanHeath  McCullough M.D.   On: 03/19/2014 18:09     EKG Interpretation None      MDM   Final diagnoses:  Hip pain    No acute bony abnormality. No falls. Plan will be symptomatic control    Rolland PorterMark Griffin Gerrard, MD 03/19/14 1836

## 2014-03-19 NOTE — Discharge Instructions (Signed)
Hip Pain  The hips join the upper legs to the lower pelvis. The bones, cartilage, tendons, and muscles of the hip joint perform a lot of work each day holding your body weight and allowing you to move around.  Hip pain is a common symptom. It can range from a minor ache to severe pain on 1 or both hips. Pain may be felt on the inside of the hip joint near the groin, or the outside near the buttocks and upper thigh. There may be swelling or stiffness as well. It occurs more often when a person walks or performs activity. There are many reasons hip pain can develop.  CAUSES   It is important to work with your caregiver to identify the cause since many conditions can impact the bones, cartilage, muscles, and tendons of the hips. Causes for hip pain include:   Broken (fractured) bones.   Separation of the thighbone from the hip socket (dislocation).   Torn cartilage of the hip joint.   Swelling (inflammation) of a tendon (tendonitis), the sac within the hip joint (bursitis), or a joint.   A weakening in the abdominal wall (hernia), affecting the nerves to the hip.   Arthritis in the hip joint or lining of the hip joint.   Pinched nerves in the back, hip, or upper thigh.   A bulging disc in the spine (herniated disc).   Rarely, bone infection or cancer.  DIAGNOSIS   The location of your hip pain will help your caregiver understand what may be causing the pain. A diagnosis is based on your medical history, your symptoms, results from your physical exam, and results from diagnostic tests. Diagnostic tests may include X-ray exams, a computerized magnetic scan (magnetic resonance imaging, MRI), or bone scan.  TREATMENT   Treatment will depend on the cause of your hip pain. Treatment may include:   Limiting activities and resting until symptoms improve.   Crutches or other walking supports (a cane or brace).   Ice, elevation, and compression.   Physical therapy or home exercises.   Shoe inserts or special  shoes.   Losing weight.   Medications to reduce pain.   Undergoing surgery.  HOME CARE INSTRUCTIONS    Only take over-the-counter or prescription medicines for pain, discomfort, or fever as directed by your caregiver.   Put ice on the injured area:   Put ice in a plastic bag.   Place a towel between your skin and the bag.   Leave the ice on for 15-20 minutes at a time, 03-04 times a day.   Keep your leg raised (elevated) when possible to lessen swelling.   Avoid activities that cause pain.   Follow specific exercises as directed by your caregiver.   Sleep with a pillow between your legs on your most comfortable side.   Record how often you have hip pain, the location of the pain, and what it feels like. This information may be helpful to you and your caregiver.   Ask your caregiver about returning to work or sports and whether you should drive.   Follow up with your caregiver for further exams, therapy, or testing as directed.  SEEK MEDICAL CARE IF:    Your pain or swelling continues or worsens after 1 week.   You are feeling unwell or have chills.   You have increasing difficulty with walking.   You have a loss of sensation or other new symptoms.   You have questions or concerns.  SEEK   IMMEDIATE MEDICAL CARE IF:    You cannot put weight on the affected hip.   You have fallen.   You have a sudden increase in pain and swelling in your hip.   You have a fever.  MAKE SURE YOU:    Understand these instructions.   Will watch your condition.   Will get help right away if you are not doing well or get worse.  Document Released: 03/29/2010 Document Revised: 01/01/2012 Document Reviewed: 03/29/2010  Exit[employer] Patient Information 2014 [employer].

## 2014-04-01 ENCOUNTER — Ambulatory Visit: Payer: Medicare Other

## 2014-04-09 ENCOUNTER — Ambulatory Visit: Payer: Medicare Other | Admitting: Neurology

## 2014-04-09 ENCOUNTER — Ambulatory Visit: Payer: Medicare Other

## 2014-04-29 ENCOUNTER — Encounter (INDEPENDENT_AMBULATORY_CARE_PROVIDER_SITE_OTHER): Payer: Self-pay

## 2014-04-29 ENCOUNTER — Ambulatory Visit
Admission: RE | Admit: 2014-04-29 | Discharge: 2014-04-29 | Disposition: A | Discharge status: Home / Self Care | Payer: Medicare Other | Source: Ambulatory Visit

## 2014-04-29 DIAGNOSIS — Z1231 Encounter for screening mammogram for malignant neoplasm of breast: Secondary | ICD-10-CM

## 2014-11-03 DIAGNOSIS — J449 Chronic obstructive pulmonary disease, unspecified: Secondary | ICD-10-CM | POA: Diagnosis not present

## 2014-11-17 DIAGNOSIS — K219 Gastro-esophageal reflux disease without esophagitis: Secondary | ICD-10-CM | POA: Diagnosis not present

## 2014-11-17 DIAGNOSIS — J449 Chronic obstructive pulmonary disease, unspecified: Secondary | ICD-10-CM | POA: Diagnosis not present

## 2014-11-17 DIAGNOSIS — F0390 Unspecified dementia without behavioral disturbance: Secondary | ICD-10-CM | POA: Diagnosis not present

## 2014-11-17 DIAGNOSIS — F419 Anxiety disorder, unspecified: Secondary | ICD-10-CM | POA: Diagnosis not present

## 2014-12-04 DIAGNOSIS — J449 Chronic obstructive pulmonary disease, unspecified: Secondary | ICD-10-CM | POA: Diagnosis not present

## 2015-01-02 DIAGNOSIS — J449 Chronic obstructive pulmonary disease, unspecified: Secondary | ICD-10-CM | POA: Diagnosis not present

## 2015-02-02 DIAGNOSIS — J449 Chronic obstructive pulmonary disease, unspecified: Secondary | ICD-10-CM | POA: Diagnosis not present

## 2015-02-16 DIAGNOSIS — K219 Gastro-esophageal reflux disease without esophagitis: Secondary | ICD-10-CM | POA: Diagnosis not present

## 2015-02-16 DIAGNOSIS — J449 Chronic obstructive pulmonary disease, unspecified: Secondary | ICD-10-CM | POA: Diagnosis not present

## 2015-02-16 DIAGNOSIS — F419 Anxiety disorder, unspecified: Secondary | ICD-10-CM | POA: Diagnosis not present

## 2015-02-16 DIAGNOSIS — J9600 Acute respiratory failure, unspecified whether with hypoxia or hypercapnia: Secondary | ICD-10-CM | POA: Diagnosis not present

## 2015-02-16 DIAGNOSIS — F0390 Unspecified dementia without behavioral disturbance: Secondary | ICD-10-CM | POA: Diagnosis not present

## 2015-03-04 DIAGNOSIS — J449 Chronic obstructive pulmonary disease, unspecified: Secondary | ICD-10-CM | POA: Diagnosis not present

## 2015-04-04 DIAGNOSIS — J449 Chronic obstructive pulmonary disease, unspecified: Secondary | ICD-10-CM | POA: Diagnosis not present

## 2015-04-06 DIAGNOSIS — J449 Chronic obstructive pulmonary disease, unspecified: Secondary | ICD-10-CM | POA: Diagnosis not present

## 2015-04-06 DIAGNOSIS — R413 Other amnesia: Secondary | ICD-10-CM | POA: Diagnosis not present

## 2015-04-06 DIAGNOSIS — Z9981 Dependence on supplemental oxygen: Secondary | ICD-10-CM | POA: Diagnosis not present

## 2015-04-06 DIAGNOSIS — Z049 Encounter for examination and observation for unspecified reason: Secondary | ICD-10-CM | POA: Diagnosis not present

## 2015-04-14 ENCOUNTER — Other Ambulatory Visit: Payer: Self-pay

## 2015-04-14 DIAGNOSIS — Z1231 Encounter for screening mammogram for malignant neoplasm of breast: Secondary | ICD-10-CM

## 2015-04-22 ENCOUNTER — Encounter: Payer: Self-pay | Admitting: Neurology

## 2015-04-22 ENCOUNTER — Ambulatory Visit (INDEPENDENT_AMBULATORY_CARE_PROVIDER_SITE_OTHER): Payer: Medicare Other | Admitting: Neurology

## 2015-04-22 VITALS — BP 121/78 | HR 75 | Ht 63.0 in | Wt 84.2 lb

## 2015-04-22 DIAGNOSIS — E43 Unspecified severe protein-calorie malnutrition: Secondary | ICD-10-CM

## 2015-04-22 DIAGNOSIS — E538 Deficiency of other specified B group vitamins: Secondary | ICD-10-CM

## 2015-04-22 DIAGNOSIS — F1027 Alcohol dependence with alcohol-induced persisting dementia: Secondary | ICD-10-CM | POA: Diagnosis not present

## 2015-04-22 MED ORDER — MEGESTROL ACETATE 400 MG/10ML PO SUSP: 800.0000 mg | Freq: Every day | ORAL | Status: AC

## 2015-04-22 NOTE — Progress Notes (Signed)
Reason for visit: Dementia  Referring physician: Dr. [place] [name] is a 74 y.o. female  History of present illness:  [name] is a 74 year old right-handed black female with a history of a progressive memory disturbance. The family has noted onset of problems with memory dating back to 2011. At that time, she was repeating herself frequently, had significant short-term memory issues. She had a history of significant alcohol abuse, and she has had chronic issues with malnutrition, and a poor appetite. The patient had the definite diagnosis of dementia in 2012. She has had a CT scan of the head in 2014 when she was admitted with an acute delirium state. The head CT was reviewed online, and appears to be relatively unremarkable, with mild atrophy seen. No significant cerebrovascular disease was noted. The patient has chronic respiratory issues with COPD. 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 She is able to bathe herself and dress herself, but she requires assistance with keeping up with medications and appointments. She continues to lose weight, she has dropped from 96 pounds to a weight of 84 pounds over the last 6 months. She denies any bowel or bladder control issues, but she does have some gait instability. She denies any numbness or weakness of extremities, but she does report some occasional headache, no dizziness. She has been placed on Namenda, tolerating the medication fairly well.   Past Medical History  Diagnosis Date  . COPD (chronic obstructive pulmonary disease)   . 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 H/O ETOH abuse   . Oxygen dependent     2l per nasal cannula  . GERD (gastroesophageal reflux disease)   . Osteopenia   . Arthritis   . Dementia   . Cataracts, bilateral   . Hx of blood clots   . Amnesia     Past Surgical History  Procedure Laterality Date  . Hip surgery    . Fracture surgery    . Cataract extraction w/phaco Right  01/08/2013    Procedure: PHACOEMULSIFICATION, POSTERIOR CHAMBER INTRAOCULAR LENS PLACEMENT;  Surgeon: Shade Flood, MD;  Location: [facility] OR;  Service: Ophthalmology;  Laterality: Right;  ATTEMPTED LOCAL ANESTHESIA; PATIENT RE-PREPPED AND DRAPED FOR GENERAL ANESTHESIA    Family History  Problem Relation Age of Onset  . Emphysema Sister     smoker  . COPD Sister   . Alzheimer's disease Sister   . Asthma Daughter   . Heart disease Father   . Deep vein thrombosis Daughter   . COPD Mother   . Alzheimer's disease Mother   . Heart disease Brother   . Alzheimer's disease Sister   . COPD Sister     Social history:  reports that she quit smoking about 4 years ago. Her smoking use included Cigarettes. She has a 25 pack-year smoking history. She has never used smokeless tobacco. She reports that she does not drink alcohol or use illicit drugs.  Medications:  Prior to Admission medications   Medication Sig Start Date End Date Taking? Authorizing Provider  albuterol (PROAIR HFA) 108 (90 BASE) MCG/ACT inhaler Inhale 2 puffs into the lungs every 6 (six) hours as needed for shortness of breath.    Yes Historical Provider, MD  albuterol (PROVENTIL) (2.5 MG/3ML) 0.083% nebulizer solution Take 2.5 mg by nebulization every 6 (six) hours as needed for shortness of breath.    Yes Historical Provider, MD  ALPRAZolam (XANAX) 0.25 MG tablet Take 1 tablet (0.25  mg total) by mouth at bedtime. 05/29/13  Yes [name] A [name], MD  arformoterol (BROVANA) 15 MCG/2ML NEBU Take 15 mcg by nebulization 2 (two) times daily.     Yes Historical Provider, MD  benzonatate (TESSALON) 100 MG capsule Take 100-200 mg by mouth every 8 (eight) hours as needed for cough.    Yes Historical Provider, MD  budesonide (PULMICORT) 0.5 MG/2ML nebulizer solution Take 0.5 mg by nebulization 2 (two) times daily.    Yes Historical Provider, MD  cetirizine (ZYRTEC) 10 MG tablet Take 10 mg by mouth every morning.    Yes Historical Provider, MD    famotidine (PEPCID) 20 MG tablet Take 20 mg by mouth at bedtime.    Yes Historical Provider, MD  Fe Cbn-Fe Gluc-FA-B12-C-DSS (FERRALET 90 PO) Take 1 tablet by mouth daily.   Yes Historical Provider, MD  feeding supplement (ENSURE COMPLETE) LIQD Take 237 mLs by mouth 2 (two) times daily between meals. 05/29/13  Yes [name] A [name], MD  folic acid (FOLVITE) 1 MG tablet Take 1 mg by mouth every morning.    Yes Historical Provider, MD  ipratropium (ATROVENT HFA) 17 MCG/ACT inhaler Inhale 2 puffs into the lungs every 6 (six) hours.   Yes Historical Provider, MD  meloxicam (MOBIC) 7.5 MG tablet Take 7.5 mg by mouth daily.   Yes Historical Provider, MD  memantine (NAMENDA XR) 28 MG CP24 24 hr capsule Take 28 mg by mouth daily.   Yes Historical Provider, MD  Polyethyl Glycol-Propyl Glycol (SYSTANE OP) Place 1 drop into the right eye daily as needed (for dry eyes).    Yes Historical Provider, MD  tiotropium (SPIRIVA) 18 MCG inhalation capsule Place 18 mcg into inhaler and inhale every morning.    Yes Historical Provider, MD  Vitamin D, Ergocalciferol, (DRISDOL) 50000 UNITS CAPS Take 50,000 Units by mouth every Monday.    Yes Historical Provider, MD  megestrol (MEGACE) 400 MG/10ML suspension Take 20 mLs (800 mg total) by mouth daily. 04/22/15   York Spanielharles K Allante Beane, MD     No Known Allergies  ROS:  Out of a complete 14 system review of symptoms, the patient complains only of the following symptoms, and all other reviewed systems are negative.  Weight loss Cough Joint pain, allergies Memory loss, confusion, headache Anxiety, not enough sleep, change in appetite Sleepiness   Blood pressure 121/78, pulse 75, height 5\' 3"  (1.6 m), weight 84 lb 3.2 oz (38.193 kg).  Physical Exam  General: The patient is alert and cooperative at the time of the examination. The patient is thin, emaciated.  Eyes: Pupils are equal, round, and reactive to light. Discs are flat bilaterally.  Neck: The neck is supple, no  carotid bruits are noted.  Respiratory: The respiratory examination is clear.  Cardiovascular: The cardiovascular examination reveals a regular rate and rhythm, no obvious murmurs or rubs are noted.  Skin: Extremities are without significant edema.  Neurologic Exam  Mental status: The patient is alert and oriented x 3 at the time of the examination. The patient has apparent normal recent and remote memory, with an apparently normal attention span and concentration ability.  Cranial nerves: Facial symmetry is present. There is good sensation of the face to pinprick and soft touch bilaterally. The strength of the facial muscles and the muscles to head turning and shoulder shrug are normal bilaterally. Speech is well enunciated, no aphasia or dysarthria is noted. Extraocular movements are full. Visual fields are full. The tongue is midline, and the patient  has symmetric elevation of the soft palate. No obvious hearing deficits are noted.  Motor: The motor testing reveals 5 over 5 strength of all 4 extremities. Good symmetric motor tone is noted throughout.  Sensory: Sensory testing is intact to pinprick, soft touch, vibration sensation, and position sense on all 4 extremities, with exception of a stocking pattern pinprick sensory deficit two thirds the way up the legs below the knees, and decreased position sensation in the right foot. No evidence of extinction is noted.  Coordination: Cerebellar testing reveals good finger-nose-finger and heel-to-shin bilaterally.  Gait and station: Gait is normal. Tandem gait is slightly unsteady. Romberg is negative. No drift is seen.  Reflexes: Deep tendon reflexes are symmetric and normal bilaterally, with exception of some decrease in ankle jerk reflexes bilaterally. Toes are downgoing bilaterally.   Assessment/Plan:  1. Progressive dementing illness  2. Malnutrition  3. Mild gait disturbance  4. History of alcohol abuse  The patient has a  prominent history of dementia in the family without Alzheimer's disease. The patient has a history of alcohol abuse, but she has had a progressive dementing illness off of alcohol. She continues to lose weight, and she may be at risk for nutritional deficiencies. She will be set up for blood work today, she is already on [place], I would not start Aricept given the weight loss. She will be placed back on Megace for the weight loss. She will follow-up in 6 months.  Marlan Palau MD 04/22/2015 7:36 PM  [facility] [address] [place], [place] [address]  Phone [phone] Fax [phone]

## 2015-04-22 NOTE — Patient Instructions (Signed)
Dementia °Dementia is a word that is used to describe problems with the brain and how it works. People with dementia have memory loss. They may also have problems with thinking, speaking, or solving problems. It can affect how they act around people, how they do their job, their mood, and their personality. These changes may not show up for a long time. Family or friends may not notice problems in the early part of this disease. °HOME CARE °The following tips are for the person living with, or caring for, the person with dementia. °Make the home safe. °· Remove locks on bathroom doors. °· Use childproof locks on cabinets where alcohol, cleaning supplies, or chemicals are stored. °· Put outlet covers in electrical outlets. °· Put in childproof locks to keep doors and windows safe. °· Remove stove knobs, or put in safety knobs that shut off on their own. °· Lower the temperature on water heaters. °· Label medicines. Lock them in a safe place. °· Keep knives, lighters, matches, power tools, and guns out of reach or in a safe place. °· Remove objects that might break or can hurt the person. °· Make sure lighting is good inside and outside. °· Put in grab bars if needed. °· Use a device that detects falls or other needs for help. °Lessen confusion. °· Keep familiar objects and people around. °· Use night lights or low lit (dim) lights at night. °· Label objects or areas. °· Use reminders, notes, or directions for daily activities or tasks. °· Keep a simple routine that is the same for waking, meals, bathing, dressing, and bedtime. °· Create a calm and quiet home. °· Put up clocks and calendars. °· Keep emergency numbers and the home address near all phones. °· Help show the different times of day. Open the curtains during the day to let light in. °Speak clearly and directly. °· Choose simple words and short sentences. °· Use a gentle, calm voice. °· Do not interrupt. °· If the person has a hard time finding a word to  use, give them the word or thought. °· Ask 1 question at a time. Give enough time for the person to answer. Repeat the question if the person does not answer. °Do things that lessen restlessness. °· Provide a comfortable bed. °· Have the same bedtime routine every night. °· Have a regular walking and activity schedule. °· Lessen naps during the day. °· Do not let the person drink a lot of caffeine. °· Go to events that are not overwhelming. °Eat well and drink fluids. °· Lessen distractions during meal times and snacks. °· Avoid foods that are too hot or too cold. °· Watch how the person chews and swallows. This is to make sure they do not choke. °Other °· Keep all vision, hearing, dental, and medical visits with the doctor. °· Only give medicines as told by the doctor. °· Watch the person's driving ability. Do not let the person drive if he or she cannot drive safely. °· Use a program that helps find a person if they become missing. You may need to register with this program. °GET HELP RIGHT AWAY IF:  °· A fever of 102° F (38.9° C) develops. °· Confusion develops or gets worse. °· Sleepiness develops or gets worse. °· Staying awake is hard to do. °· New behavior problems start like mood swings, aggression, and seeing things that are not there. °· Problems with balance, speech, or falling develop. °· Problems swallowing develop. °· Any   problems of another sickness develop. °MAKE SURE YOU: °· Understand these instructions. °· Will watch his or her condition. °· Will get help right away if he or she is not doing well or gets worse. °Document Released: 09/21/2008 Document Revised: 01/01/2012 Document Reviewed: 03/06/2011 °ExitCare® Patient Information ©2015 [employer]. This information is not intended to replace advice given to you by your health care provider. Make sure you discuss any questions you have with your health care provider. ° °

## 2015-04-29 LAB — COPPER, SERUM: Copper: 95 ug/dL (ref 72–166)

## 2015-04-29 LAB — METHYLMALONIC ACID, SERUM: Methylmalonic Acid: 189 nmol/L (ref 0–378)

## 2015-04-29 LAB — RPR: RPR Ser Ql: NONREACTIVE

## 2015-04-29 LAB — HIV ANTIBODY (ROUTINE TESTING W REFLEX): HIV SCREEN 4TH GENERATION: NONREACTIVE

## 2015-04-29 LAB — VITAMIN B12: VITAMIN B 12: 445 pg/mL (ref 211–946)

## 2015-04-29 LAB — VITAMIN B1: THIAMINE: 120.1 nmol/L (ref 66.5–200.0)

## 2015-04-30 ENCOUNTER — Telehealth: Payer: Self-pay

## 2015-04-30 NOTE — Telephone Encounter (Signed)
I called and spoke to the patient's daughter. Relayed that lab results were normal.

## 2015-04-30 NOTE — Telephone Encounter (Signed)
-----   Message from York Spanielharles K Willis, MD sent at 04/29/2015  1:53 PM EDT -----  The blood work results are unremarkable. Please call the patient.  ----- Message -----    From: [facility] Lab Results In Interface    Sent: 04/23/2015   7:45 AM      To: York Spanielharles K Willis, MD

## 2015-05-03 ENCOUNTER — Ambulatory Visit: Payer: Self-pay

## 2015-05-04 DIAGNOSIS — J449 Chronic obstructive pulmonary disease, unspecified: Secondary | ICD-10-CM | POA: Diagnosis not present

## 2015-05-17 ENCOUNTER — Ambulatory Visit (INDEPENDENT_AMBULATORY_CARE_PROVIDER_SITE_OTHER): Payer: Medicare Other | Admitting: Critical Care Medicine

## 2015-05-17 ENCOUNTER — Encounter: Payer: Self-pay | Admitting: Critical Care Medicine

## 2015-05-17 ENCOUNTER — Ambulatory Visit (INDEPENDENT_AMBULATORY_CARE_PROVIDER_SITE_OTHER)
Admission: RE | Admit: 2015-05-17 | Discharge: 2015-05-17 | Disposition: A | Discharge status: Home / Self Care | Payer: Medicare Other | Source: Ambulatory Visit | Attending: Critical Care Medicine | Admitting: Critical Care Medicine

## 2015-05-17 VITALS — BP 124/80 | HR 80 | Temp 98.4°F | Ht 63.0 in | Wt 85.4 lb

## 2015-05-17 DIAGNOSIS — J45909 Unspecified asthma, uncomplicated: Secondary | ICD-10-CM | POA: Diagnosis not present

## 2015-05-17 DIAGNOSIS — R9389 Abnormal findings on diagnostic imaging of other specified body structures: Secondary | ICD-10-CM

## 2015-05-17 DIAGNOSIS — J449 Chronic obstructive pulmonary disease, unspecified: Secondary | ICD-10-CM

## 2015-05-17 DIAGNOSIS — E43 Unspecified severe protein-calorie malnutrition: Secondary | ICD-10-CM

## 2015-05-17 DIAGNOSIS — J432 Centrilobular emphysema: Secondary | ICD-10-CM

## 2015-05-17 DIAGNOSIS — J9611 Chronic respiratory failure with hypoxia: Secondary | ICD-10-CM

## 2015-05-17 DIAGNOSIS — R634 Abnormal weight loss: Secondary | ICD-10-CM

## 2015-05-17 DIAGNOSIS — R938 Abnormal findings on diagnostic imaging of other specified body structures: Secondary | ICD-10-CM

## 2015-05-17 DIAGNOSIS — R0602 Shortness of breath: Secondary | ICD-10-CM | POA: Diagnosis not present

## 2015-05-17 NOTE — Patient Instructions (Addendum)
No change in medications.  A chest xray will be obtained, we will call the results Increase oxygen 2Liter rest,   3  L exertion Return 4 months, a new MD will be assigned

## 2015-05-17 NOTE — Assessment & Plan Note (Signed)
Abn on cxr Plan Obtain ct chest

## 2015-05-17 NOTE — Progress Notes (Signed)
Subjective:    Patient ID: [name], female    DOB: 09/05/1941, 74 y.o.   MRN: [id number]  HPI 05/17/2015 Chief Complaint  Patient presents with  . Yearly Follow up    Last seen 08/2013.  Taking rest breaks more frequently d/t SOB x 2-3 months.  Minimal nonprod cough.  No wheezing, chest tightness, or CP.     Pt worse with exertion.  On 2L 24/7  No qhs dyspnea.  Min cough.  No wheezing. No chest pain or tightness.  Pt denies any significant sore throat, nasal congestion or excess secretions, fever, chills, sweats, pleurtic or exertional chest pain, orthopnea PND, or leg swelling Pt denies any increase in rescue therapy over baseline, denies waking up needing it or having any early am or nocturnal exacerbations of coughing/wheezing/or dyspnea. Pt also denies any obvious fluctuation in symptoms with  weather or environmental change or other alleviating or aggravating factors Has lost 10# weight and on megace/ensure.   Current Medications, Allergies, Complete Past Medical History, Past Surgical History, Family History, and Social History were reviewed in [employer] electronic medical record per todays encounter:  05/17/2015 Review of Systems  Constitutional: Positive for appetite change.       Weight loss:  10#    HENT: Negative.  Negative for ear pain, postnasal drip, rhinorrhea, sinus pressure, sore throat, trouble swallowing and voice change.   Eyes: Negative.   Respiratory: Positive for shortness of breath. Negative for apnea, cough, choking, chest tightness, wheezing and stridor.   Cardiovascular: Negative.  Negative for chest pain, palpitations and leg swelling.  Gastrointestinal: Negative.  Negative for nausea, vomiting, abdominal pain and abdominal distention.  Genitourinary: Negative.   Musculoskeletal: Negative.  Negative for myalgias and arthralgias.  Skin: Negative.  Negative for rash.  Allergic/Immunologic: Negative.  Negative for environmental allergies and food  allergies.  Neurological: Negative.  Negative for dizziness, syncope, weakness and headaches.  Hematological: Negative.  Negative for adenopathy. Does not bruise/bleed easily.  Psychiatric/Behavioral: Negative.  Negative for sleep disturbance and agitation. The patient is not nervous/anxious.        Objective:   Physical Exam Filed Vitals:   05/17/15 1028  BP: 124/80  Pulse: 80  Temp: 98.4 F (36.9 C)  TempSrc: Oral  Height: 5\' 3"  (1.6 m)  Weight: 85 lb 6.4 oz (38.737 kg)  SpO2: 96%    Gen: Pleasant, thin cachectic , in no distress,  normal affect  ENT: No lesions,  mouth clear,  oropharynx clear, no postnasal drip  Neck: No JVD, no TMG, no carotid bruits  Lungs: No use of accessory muscles, no dullness to percussion, distant BS  Cardiovascular: RRR, heart sounds normal, no murmur or gallops, no peripheral edema  Abdomen: soft and NT, no HSM,  BS normal  Musculoskeletal: No deformities, no cyanosis or clubbing  Neuro: alert, non focal  Skin: Warm, no lesions or rashes  Reviewed: Dg Chest 2 View  05/17/2015   CLINICAL DATA:  Shortness of breath and 10 lb weight loss over the past 6 months, history of asthma -COPD and previous tobacco use.  EXAM: CHEST  2 VIEW  COMPARISON:  PA and lateral chest x-ray of May 27, 2013  FINDINGS: The lungs are hyperinflated with hemidiaphragm flattening and increased AP dimension of the thorax. There is no pleural effusion. There is increased density lateral to the left heart border that is new since a previous study but is in part due to the shadow of the left nipple.  The heart and pulmonary vascularity are normal. The mediastinum is normal in width. The bony thorax exhibits no acute abnormality.  IMPRESSION: COPD. Increased density lateral to the left cardiac apex likely reflects overlap of rib dense and the nipple shadow. However, given the patient's smoking history and unexplained weight loss, CT scanning of the chest is recommended.    Electronically Signed   By: David  [place] M.D.   On: 05/17/2015 12:30    desat on 2L exert, needed 3L to keep sats >90      Assessment & Plan:  I personally reviewed all images and lab data in the [facility] system as well as any outside material available during this office visit and agree with the  radiology impressions.   COPD (chronic obstructive pulmonary disease)Gold D Gold D copd oxygen dependent now needing increased oxygen 3L with exertion Plan Increase exertional O2 to 3L, cont rest/sleep at 2L Cont neb meds    Severe protein-calorie malnutrition Ongoing weight loss despite nutritional supp chk cxr >>abn>>ct chest to be obtained  Abnormal chest x-ray Abn on cxr Plan Obtain ct chest     [name] was seen today for yearly follow up.  Diagnoses and all orders for this visit:  Centrilobular emphysema Orders: -     DG Chest 2 View; Future -     AMB REFERRAL FOR DME  Loss of weight Orders: -     DG Chest 2 View; Future  Chronic respiratory failure with hypoxia Orders: -     AMB REFERRAL FOR DME  Abnormal chest x-ray  Severe protein-calorie malnutrition    I had an extended discussion with the patient and or family lasting 10 minutes of a 25 minute visit including:  Need for cxr and f/u ct scanning if indicated for weight loss, need to increase oxygen 3L exertion

## 2015-05-17 NOTE — Assessment & Plan Note (Signed)
Ongoing weight loss despite nutritional supp chk cxr >>abn>>ct chest to be obtained

## 2015-05-17 NOTE — Assessment & Plan Note (Signed)
Gold D copd oxygen dependent now needing increased oxygen 3L with exertion Plan Increase exertional O2 to 3L, cont rest/sleep at 2L Cont neb meds

## 2015-05-18 ENCOUNTER — Other Ambulatory Visit: Payer: Self-pay | Admitting: Critical Care Medicine

## 2015-05-18 DIAGNOSIS — R9389 Abnormal findings on diagnostic imaging of other specified body structures: Secondary | ICD-10-CM

## 2015-05-18 DIAGNOSIS — J42 Unspecified chronic bronchitis: Secondary | ICD-10-CM

## 2015-05-18 NOTE — Progress Notes (Signed)
Quick Note:  Spoke with pt's daughter, Babette Relic. Discussed cxr results and recs per Dr. Delford Field. Tammy verbalized understanding, would like to proceed with CT, and will inform pt. CT Chest order placed. Tammy aware PCCs will call her to schedule. She verbalized understanding, is in agreement with plan, and voiced no further questions or concerns at this time. ______

## 2015-05-19 ENCOUNTER — Other Ambulatory Visit (INDEPENDENT_AMBULATORY_CARE_PROVIDER_SITE_OTHER): Payer: Medicare Other

## 2015-05-19 ENCOUNTER — Ambulatory Visit (INDEPENDENT_AMBULATORY_CARE_PROVIDER_SITE_OTHER)
Admission: RE | Admit: 2015-05-19 | Discharge: 2015-05-19 | Disposition: A | Discharge status: Home / Self Care | Payer: Medicare Other | Source: Ambulatory Visit | Attending: Critical Care Medicine | Admitting: Critical Care Medicine

## 2015-05-19 ENCOUNTER — Telehealth: Payer: Self-pay | Admitting: Critical Care Medicine

## 2015-05-19 DIAGNOSIS — J42 Unspecified chronic bronchitis: Secondary | ICD-10-CM

## 2015-05-19 DIAGNOSIS — R9389 Abnormal findings on diagnostic imaging of other specified body structures: Secondary | ICD-10-CM

## 2015-05-19 DIAGNOSIS — J439 Emphysema, unspecified: Secondary | ICD-10-CM | POA: Diagnosis not present

## 2015-05-19 DIAGNOSIS — R938 Abnormal findings on diagnostic imaging of other specified body structures: Secondary | ICD-10-CM

## 2015-05-19 LAB — BASIC METABOLIC PANEL
BUN: 23 mg/dL (ref 6–23)
CALCIUM: 9.5 mg/dL (ref 8.4–10.5)
CO2: 35 mEq/L — ABNORMAL HIGH (ref 19–32)
CREATININE: 0.7 mg/dL (ref 0.40–1.20)
Chloride: 107 mEq/L (ref 96–112)
GFR: 105.38 mL/min (ref >60.00)
GLUCOSE: 91 mg/dL (ref 70–99)
POTASSIUM: 3.7 meq/L (ref 3.5–5.1)
SODIUM: 145 meq/L (ref 135–145)

## 2015-05-19 NOTE — Progress Notes (Signed)
Quick Note:  Spoke with pt's daughter and notified of results per Dr. Delford Field. Pt verbalized understanding and denied any questions, and will inform the pt  ______

## 2015-05-19 NOTE — Telephone Encounter (Signed)
Spoke with Kennyth Arnold She states pt's daughter Babette Relic accompanied pt to appt today for ct chest  She tried to go back into the ct scan room and got upset when Stacy told her to wait in the lobby  She then approached Stacy and advised that she did not know why we were doing the ct scan in the first place  Stacy looked in Epic and was able to view were CJ spoke with her about cxr results and that PW rec ct based on abn on cxr  Daughter states that the result of cxr was not explained to her clearly enough  I called and spoke with Tammy and apologized for any miscommunication  She states that she understood the result of cxr, but was upset that we did not tell her she would need contrast dye to have the ct chest  I apologized again, and she was very appreciative of this and for the call made to her in general  I also notified her of the pt's ct results She verbalized understanding and denied any questions and will inform the pt  She states that there is nothing further needed  I will forward to CJ as [place]

## 2015-05-20 NOTE — Telephone Encounter (Signed)
I remember the conversation with [name], pt's daughter, regarding the cxr results and recs from Dr. Delford Field.  I did advise [name] CT would be with contrast and provided the opportunity to ask questions.  I am sorry she did not recall this and for any inconvenience.  Thank you for reaching out to her to address her questions and concerns.

## 2015-06-04 DIAGNOSIS — E782 Mixed hyperlipidemia: Secondary | ICD-10-CM | POA: Diagnosis not present

## 2015-06-04 DIAGNOSIS — R627 Adult failure to thrive: Secondary | ICD-10-CM | POA: Diagnosis not present

## 2015-06-04 DIAGNOSIS — R413 Other amnesia: Secondary | ICD-10-CM | POA: Diagnosis not present

## 2015-06-04 DIAGNOSIS — E539 Vitamin B deficiency, unspecified: Secondary | ICD-10-CM | POA: Diagnosis not present

## 2015-06-04 DIAGNOSIS — J449 Chronic obstructive pulmonary disease, unspecified: Secondary | ICD-10-CM | POA: Diagnosis not present

## 2015-06-04 DIAGNOSIS — Z79899 Other long term (current) drug therapy: Secondary | ICD-10-CM | POA: Diagnosis not present

## 2015-06-04 DIAGNOSIS — Z0000 Encounter for general adult medical examination without abnormal findings: Secondary | ICD-10-CM | POA: Diagnosis not present

## 2015-06-04 DIAGNOSIS — E559 Vitamin D deficiency, unspecified: Secondary | ICD-10-CM | POA: Diagnosis not present

## 2015-06-09 DIAGNOSIS — H2512 Age-related nuclear cataract, left eye: Secondary | ICD-10-CM | POA: Diagnosis not present

## 2015-06-09 DIAGNOSIS — H524 Presbyopia: Secondary | ICD-10-CM | POA: Diagnosis not present

## 2015-06-21 ENCOUNTER — Emergency Department (HOSPITAL_COMMUNITY): Payer: Medicare Other

## 2015-06-21 ENCOUNTER — Emergency Department (HOSPITAL_COMMUNITY)
Admission: EM | Admit: 2015-06-21 | Discharge: 2015-06-22 | Disposition: A | Discharge status: Home / Self Care | Payer: Medicare Other | Attending: Emergency Medicine | Admitting: Emergency Medicine

## 2015-06-21 DIAGNOSIS — Z7951 Long term (current) use of inhaled steroids: Secondary | ICD-10-CM | POA: Diagnosis not present

## 2015-06-21 DIAGNOSIS — M8580 Other specified disorders of bone density and structure, unspecified site: Secondary | ICD-10-CM | POA: Insufficient documentation

## 2015-06-21 DIAGNOSIS — F0390 Unspecified dementia without behavioral disturbance: Secondary | ICD-10-CM | POA: Diagnosis not present

## 2015-06-21 DIAGNOSIS — J441 Chronic obstructive pulmonary disease with (acute) exacerbation: Secondary | ICD-10-CM | POA: Diagnosis not present

## 2015-06-21 DIAGNOSIS — Z79899 Other long term (current) drug therapy: Secondary | ICD-10-CM | POA: Insufficient documentation

## 2015-06-21 DIAGNOSIS — Z9981 Dependence on supplemental oxygen: Secondary | ICD-10-CM | POA: Insufficient documentation

## 2015-06-21 DIAGNOSIS — J439 Emphysema, unspecified: Secondary | ICD-10-CM | POA: Diagnosis not present

## 2015-06-21 DIAGNOSIS — Z8669 Personal history of other diseases of the nervous system and sense organs: Secondary | ICD-10-CM | POA: Insufficient documentation

## 2015-06-21 DIAGNOSIS — Z87891 Personal history of nicotine dependence: Secondary | ICD-10-CM | POA: Insufficient documentation

## 2015-06-21 DIAGNOSIS — I499 Cardiac arrhythmia, unspecified: Secondary | ICD-10-CM | POA: Diagnosis not present

## 2015-06-21 DIAGNOSIS — M1990 Unspecified osteoarthritis, unspecified site: Secondary | ICD-10-CM | POA: Diagnosis not present

## 2015-06-21 DIAGNOSIS — Z86718 Personal history of other venous thrombosis and embolism: Secondary | ICD-10-CM | POA: Diagnosis not present

## 2015-06-21 DIAGNOSIS — Z8719 Personal history of other diseases of the digestive system: Secondary | ICD-10-CM | POA: Diagnosis not present

## 2015-06-21 DIAGNOSIS — R0602 Shortness of breath: Secondary | ICD-10-CM | POA: Diagnosis not present

## 2015-06-21 LAB — BASIC METABOLIC PANEL
ANION GAP: 5 (ref 5–15)
BUN: 27 mg/dL — ABNORMAL HIGH (ref 6–20)
CHLORIDE: 104 mmol/L (ref 101–111)
CO2: 31 mmol/L (ref 22–32)
Calcium: 9.8 mg/dL (ref 8.9–10.3)
Creatinine, Ser: 0.82 mg/dL (ref 0.44–1.00)
GFR calc Af Amer: >60 mL/min (ref >60)
Glucose, Bld: 131 mg/dL — ABNORMAL HIGH (ref 65–99)
POTASSIUM: 4.4 mmol/L (ref 3.5–5.1)
SODIUM: 140 mmol/L (ref 135–145)

## 2015-06-21 LAB — CBC
HEMATOCRIT: 37.1 % (ref 36.0–46.0)
HEMOGLOBIN: 11.5 g/dL — AB (ref 12.0–15.0)
MCH: 24.5 pg — ABNORMAL LOW (ref 26.0–34.0)
MCHC: 31 g/dL (ref 30.0–36.0)
MCV: 79.1 fL (ref 78.0–100.0)
Platelets: 211 10*3/uL (ref 150–400)
RBC: 4.69 MIL/uL (ref 3.87–5.11)
RDW: 15.4 % (ref 11.5–15.5)
WBC: 5.7 10*3/uL (ref 4.0–10.5)

## 2015-06-21 MED ORDER — ALBUTEROL SULFATE (2.5 MG/3ML) 0.083% IN NEBU
5.0000 mg | INHALATION_SOLUTION | Freq: Once | RESPIRATORY_TRACT | Status: AC
  Administered 2015-06-21: 5 mg via RESPIRATORY_TRACT
  Filled 2015-06-21: qty 6

## 2015-06-21 NOTE — ED Notes (Signed)
Pt's daughter states that she has been taking her O2 off of her face. States that she looked like she was working harder to breathe so she brought her here and put her O2 back on. O2 WNL here. Alert and oriented per norm.

## 2015-06-21 NOTE — ED Provider Notes (Signed)
CSN: [id number]     Arrival date & time 06/21/15  2204 History  This chart was scribed for Tomasita Crumble, MD by Freida Busman, ED Scribe. This patient was seen in room WA18/WA18 and the patient's care was started 11:08 PM.    Chief Complaint  Patient presents with  . Shortness of Breath    The history is provided by a relative (Daughter). No language interpreter was used.    HPI Comments:  [name] is a 74 y.o. female with a history of COPD and dementia who presents to the Emergency Department with her daughter who reports persistent SOB this evening. Daughter notes pt home O2 dependent (3L) and lately pt has been removing the mask.  Daughter states pt appeared to be working harder to breath.  She denies any recent infections or fevers.  Patient has not complained of pain anywhere.  After putting O2 back on the patient, patient returned back to baseline.  She states when she leaves for work, sometimes her sister does not make the patient wear O2  Past Medical History  Diagnosis Date  . COPD (chronic obstructive pulmonary disease)   . 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 H/O ETOH abuse   . Oxygen dependent     2l per nasal cannula  . GERD (gastroesophageal reflux disease)   . Osteopenia   . Arthritis   . Dementia   . Cataracts, bilateral   . Hx of blood clots   . Amnesia    Past Surgical History  Procedure Laterality Date  . Hip surgery    . Fracture surgery    . Cataract extraction w/phaco Right 01/08/2013    Procedure: PHACOEMULSIFICATION, POSTERIOR CHAMBER INTRAOCULAR LENS PLACEMENT;  Surgeon: Shade Flood, MD;  Location: [facility] OR;  Service: Ophthalmology;  Laterality: Right;  ATTEMPTED LOCAL ANESTHESIA; PATIENT RE-PREPPED AND DRAPED FOR GENERAL ANESTHESIA   Family History  Problem Relation Age of Onset  . Emphysema Sister     smoker  . COPD Sister   . Alzheimer's disease Sister   . Asthma Daughter   . Heart disease Father   . Deep vein thrombosis Daughter   . COPD Mother   .  Alzheimer's disease Mother   . Heart disease Brother   . Alzheimer's disease Sister   . COPD Sister    Social History  Substance Use Topics  . Smoking status: Former Smoker -- 0.50 packs/day for 50 years    Types: Cigarettes    Quit date: 02/21/2011  . Smokeless tobacco: Never Used  . Alcohol Use: No     Comment: H/O abuse.  None now.   OB History    No data available     Review of Systems  Unable to perform ROS: Dementia     Allergies  Review of patient's allergies indicates no known allergies.  Home Medications   Prior to Admission medications   Medication Sig Start Date End Date Taking? Authorizing Provider  albuterol (PROAIR HFA) 108 (90 BASE) MCG/ACT inhaler Inhale 2 puffs into the lungs every 6 (six) hours as needed for shortness of breath.     Historical Provider, MD  albuterol (PROVENTIL) (2.5 MG/3ML) 0.083% nebulizer solution Take 2.5 mg by nebulization every 6 (six) hours as needed for shortness of breath.     Historical Provider, MD  ALPRAZolam Prudy Feeler) 0.25 MG tablet Take 1 tablet (0.25 mg total) by mouth at bedtime. 05/29/13   [name] A [name], MD  arformoterol (BROVANA) 15 MCG/2ML NEBU Take 15  mcg by nebulization 2 (two) times daily.      Historical Provider, MD  benzonatate (TESSALON) 100 MG capsule Take 100-200 mg by mouth every 8 (eight) hours as needed for cough.     Historical Provider, MD  budesonide (PULMICORT) 0.5 MG/2ML nebulizer solution Take 0.5 mg by nebulization 2 (two) times daily.     Historical Provider, MD  cetirizine (ZYRTEC) 10 MG tablet Take 10 mg by mouth every morning.     Historical Provider, MD  Cholecalciferol (VITAMIN D PO) Take by mouth daily.    Historical Provider, MD  famotidine (PEPCID) 20 MG tablet Take 20 mg by mouth at bedtime.     Historical Provider, MD  feeding supplement (ENSURE COMPLETE) LIQD Take 237 mLs by mouth 2 (two) times daily between meals. 05/29/13   [name] A [name], MD  FERROUS SULFATE PO Take by mouth daily.     Historical Provider, MD  folic acid (FOLVITE) 1 MG tablet Take 1 mg by mouth every morning.     Historical Provider, MD  ipratropium (ATROVENT HFA) 17 MCG/ACT inhaler Inhale 2 puffs into the lungs every 6 (six) hours as needed.     Historical Provider, MD  megestrol (MEGACE) 400 MG/10ML suspension Take 20 mLs (800 mg total) by mouth daily. 04/22/15   York Spaniel, MD  meloxicam (MOBIC) 7.5 MG tablet Take 7.5 mg by mouth daily.    Historical Provider, MD  memantine (NAMENDA XR) 28 MG CP24 24 hr capsule Take 28 mg by mouth daily.    Historical Provider, MD  Multiple Vitamin (MULTIVITAMIN) tablet Take 1 tablet by mouth daily.    Historical Provider, MD  Polyethyl Glycol-Propyl Glycol (SYSTANE OP) Place 1 drop into the right eye daily as needed (for dry eyes).     Historical Provider, MD  tiotropium (SPIRIVA) 18 MCG inhalation capsule Place 18 mcg into inhaler and inhale every morning.     Historical Provider, MD   BP 102/64 mmHg  Pulse 93  Temp(Src) 98.3 F (36.8 C) (Oral)  Resp 20  SpO2 96% Physical Exam  Constitutional: She appears well-developed and well-nourished. No distress.  HENT:  Head: Normocephalic and atraumatic.  Nose: Nose normal.  Mouth/Throat: Oropharynx is clear and moist. No oropharyngeal exudate.  [place] in place  Eyes: Conjunctivae and EOM are normal. Pupils are equal, round, and reactive to light. No scleral icterus.  Neck: Normal range of motion. Neck supple. No JVD present. No tracheal deviation present. No thyromegaly present.  Cardiovascular: Normal rate and normal heart sounds.  An irregular rhythm present. Exam reveals no gallop and no friction rub.   No murmur heard. Pulmonary/Chest: Effort normal and breath sounds normal. No respiratory distress. She has no wheezes. She exhibits no tenderness.  Abdominal: Soft. Bowel sounds are normal. She exhibits no distension and no mass. There is no tenderness. There is no rebound and no guarding.  Musculoskeletal: Normal  range of motion. She exhibits no edema or tenderness.  Lymphadenopathy:    She has no cervical adenopathy.  Neurological: She is alert.  Skin: Skin is warm and dry. No rash noted. No erythema. No pallor.  Nursing note and vitals reviewed.   ED Course  Procedures   DIAGNOSTIC STUDIES:  Oxygen Saturation is 96% on 3L [place], normal by my interpretation.    COORDINATION OF CARE:  11:12 PM Discussed treatment plan with daughter and pt at bedside.  Labs Review Labs Reviewed  BASIC METABOLIC PANEL - Abnormal; Notable for the following:  Glucose, Bld 131 (*)    BUN 27 (*)    All other components within normal limits  CBC - Abnormal; Notable for the following:    Hemoglobin 11.5 (*)    MCH 24.5 (*)    All other components within normal limits  URINALYSIS, ROUTINE W REFLEX MICROSCOPIC (NOT AT [facility])  Rosezena Sensor, ED    Imaging Review Dg Chest 2 View  06/21/2015   CLINICAL DATA:  Dyspnea for several days.  EXAM: CHEST  2 VIEW  COMPARISON:  05/17/2015  FINDINGS: There is hyperinflation. Emphysematous changes are present bilaterally. No superimposed acute infiltrate or congestive heart failure is evident. There are no pleural effusions. Heart size is normal. Pulmonary vasculature is normal. Hilar and mediastinal contours are unremarkable.  IMPRESSION: Hyperinflation and emphysematous changes. No acute findings are evident.   Electronically Signed   By: Ellery Plunk M.D.   On: 06/21/2015 23:55   I have personally reviewed and evaluated these images and lab results as part of my medical decision-making.   EKG Interpretation   Date/Time:  Monday June 21 2015 22:26:50 EDT Ventricular Rate:  97 PR Interval:  114 QRS Duration: 60 QT Interval:  336 QTC Calculation: 426 R Axis:   79 Text Interpretation:  Normal sinus rhythm Right atrial enlargement Septal  infarct , age undetermined Abnormal ECG No significant change since last  tracing Confirmed by Erroll Luna  [phone]) on 06/21/2015 11:00:10  PM      MDM   Final diagnoses:  None   Patient presents to the ED for shortness of breath.  This is in the setting of having her O2 off for 8 hours.  Will perform ED workup for shortness of breath, but likely cause is lack of O2.  Daughter states she is now back to her baseline.  Patient has no complaints as well.  EKG shows no significant changes. Chest x-ray is negative for pneumonia or effusions. Patient's laboratory studies at her baseline. Again, her shortness of breath is likely due to her lack of oxygen. Daughter was advised that a home nurse may be warranted in this case. Primary care follow-up was advised within 3 days. Her vital signs remain within her normal limits and she is safe for discharge.   I personally performed the services described in this documentation, which was scribed in my presence. The recorded information has been reviewed and is accurate.    Tomasita Crumble, MD 06/22/15 [phone]

## 2015-06-22 DIAGNOSIS — H35371 Puckering of macula, right eye: Secondary | ICD-10-CM | POA: Diagnosis not present

## 2015-06-22 DIAGNOSIS — H2512 Age-related nuclear cataract, left eye: Secondary | ICD-10-CM | POA: Diagnosis not present

## 2015-06-22 LAB — URINALYSIS, ROUTINE W REFLEX MICROSCOPIC
BILIRUBIN URINE: NEGATIVE
Glucose, UA: NEGATIVE mg/dL
HGB URINE DIPSTICK: NEGATIVE
KETONES UR: NEGATIVE mg/dL
NITRITE: NEGATIVE
PH: 6.5 (ref 5.0–8.0)
Protein, ur: NEGATIVE mg/dL
SPECIFIC GRAVITY, URINE: 1.019 (ref 1.005–1.030)
Urobilinogen, UA: 0.2 mg/dL (ref 0.0–1.0)

## 2015-06-22 LAB — URINE MICROSCOPIC-ADD ON

## 2015-06-22 LAB — I-STAT TROPONIN, ED: Troponin i, poc: 0.01 ng/mL (ref 0.00–0.08)

## 2015-06-22 NOTE — Discharge Instructions (Signed)
Shortness of Breath Ms. Hauger was seen today for difficulty breathing.  Her evaluation in the ED today was normal.  This likely occurred due to lack of oxygen at home.  Be sure she wears oxygen at all times.  See a primary care doctor within 3 days for close follow up and to establish a home nurse to help.  If any symptoms worsen, come back to the ED immediately.  Thank you. Shortness of breath means you have trouble breathing. Shortness of breath needs medical care right away. HOME CARE   Do not smoke.  Avoid being around chemicals or things (paint fumes, dust) that may bother your breathing.  Rest as needed. Slowly begin your normal activities.  Only take medicines as told by your doctor.  Keep all doctor visits as told. GET HELP RIGHT AWAY IF:   Your shortness of breath gets worse.  You feel lightheaded, pass out (faint), or have a cough that is not helped by medicine.  You cough up blood.  You have pain with breathing.  You have pain in your chest, arms, shoulders, or belly (abdomen).  You have a fever.  You cannot walk up stairs or exercise the way you normally do.  You do not get better in the time expected.  You have a hard time doing normal activities even with rest.  You have problems with your medicines.  You have any new symptoms. MAKE SURE YOU:  Understand these instructions.  Will watch your condition.  Will get help right away if you are not doing well or get worse. Document Released: 03/27/2008 Document Revised: 10/14/2013 Document Reviewed: 12/25/2011 [facility] Patient Information 2015 [place], [place]. This information is not intended to replace advice given to you by your health care provider. Make sure you discuss any questions you have with your health care provider.

## 2015-06-25 DIAGNOSIS — Z9981 Dependence on supplemental oxygen: Secondary | ICD-10-CM | POA: Diagnosis not present

## 2015-06-25 DIAGNOSIS — R413 Other amnesia: Secondary | ICD-10-CM | POA: Diagnosis not present

## 2015-06-25 DIAGNOSIS — Z09 Encounter for follow-up examination after completed treatment for conditions other than malignant neoplasm: Secondary | ICD-10-CM | POA: Diagnosis not present

## 2015-06-25 DIAGNOSIS — J449 Chronic obstructive pulmonary disease, unspecified: Secondary | ICD-10-CM | POA: Diagnosis not present

## 2015-07-05 DIAGNOSIS — J449 Chronic obstructive pulmonary disease, unspecified: Secondary | ICD-10-CM | POA: Diagnosis not present

## 2015-07-07 ENCOUNTER — Encounter (HOSPITAL_COMMUNITY): Payer: Self-pay | Admitting: *Deleted

## 2015-07-07 DIAGNOSIS — Z791 Long term (current) use of non-steroidal anti-inflammatories (NSAID): Secondary | ICD-10-CM | POA: Diagnosis not present

## 2015-07-07 DIAGNOSIS — Z79899 Other long term (current) drug therapy: Secondary | ICD-10-CM | POA: Insufficient documentation

## 2015-07-07 DIAGNOSIS — Z9981 Dependence on supplemental oxygen: Secondary | ICD-10-CM | POA: Diagnosis not present

## 2015-07-07 DIAGNOSIS — J449 Chronic obstructive pulmonary disease, unspecified: Secondary | ICD-10-CM | POA: Diagnosis not present

## 2015-07-07 DIAGNOSIS — M1990 Unspecified osteoarthritis, unspecified site: Secondary | ICD-10-CM | POA: Diagnosis not present

## 2015-07-07 DIAGNOSIS — Z87891 Personal history of nicotine dependence: Secondary | ICD-10-CM | POA: Diagnosis not present

## 2015-07-07 DIAGNOSIS — H269 Unspecified cataract: Secondary | ICD-10-CM | POA: Insufficient documentation

## 2015-07-07 DIAGNOSIS — Z8719 Personal history of other diseases of the digestive system: Secondary | ICD-10-CM | POA: Diagnosis not present

## 2015-07-07 DIAGNOSIS — F0391 Unspecified dementia with behavioral disturbance: Secondary | ICD-10-CM | POA: Insufficient documentation

## 2015-07-07 DIAGNOSIS — F0390 Unspecified dementia without behavioral disturbance: Secondary | ICD-10-CM | POA: Diagnosis not present

## 2015-07-07 DIAGNOSIS — R05 Cough: Secondary | ICD-10-CM | POA: Diagnosis present

## 2015-07-07 DIAGNOSIS — J439 Emphysema, unspecified: Secondary | ICD-10-CM | POA: Diagnosis not present

## 2015-07-07 NOTE — ED Notes (Signed)
Patient presents with 2 daughters stating since last Tuesday started becoming more demented.  Changes to her meds made 2 weeks ago.  Increased Xanax 0.25mg  to 3 times a day, Pepcid  2 times a day and Tessalon Pearls  2 times a day

## 2015-07-08 ENCOUNTER — Emergency Department (HOSPITAL_COMMUNITY): Payer: Medicare Other

## 2015-07-08 ENCOUNTER — Encounter (HOSPITAL_COMMUNITY): Payer: Self-pay | Admitting: *Deleted

## 2015-07-08 ENCOUNTER — Emergency Department (HOSPITAL_COMMUNITY)
Admission: EM | Admit: 2015-07-08 | Discharge: 2015-07-09 | Disposition: A | Discharge status: Home / Self Care | Payer: Medicare Other | Attending: Emergency Medicine | Admitting: Emergency Medicine

## 2015-07-08 DIAGNOSIS — J439 Emphysema, unspecified: Secondary | ICD-10-CM | POA: Diagnosis not present

## 2015-07-08 DIAGNOSIS — J449 Chronic obstructive pulmonary disease, unspecified: Secondary | ICD-10-CM

## 2015-07-08 DIAGNOSIS — F0391 Unspecified dementia with behavioral disturbance: Secondary | ICD-10-CM

## 2015-07-08 LAB — COMPREHENSIVE METABOLIC PANEL
ALT: 13 U/L — ABNORMAL LOW (ref 14–54)
AST: 23 U/L (ref 15–41)
Albumin: 3.6 g/dL (ref 3.5–5.0)
Alkaline Phosphatase: 56 U/L (ref 38–126)
Anion gap: 6 (ref 5–15)
BUN: 22 mg/dL — ABNORMAL HIGH (ref 6–20)
CHLORIDE: 102 mmol/L (ref 101–111)
CO2: 32 mmol/L (ref 22–32)
Calcium: 9.5 mg/dL (ref 8.9–10.3)
Creatinine, Ser: 0.7 mg/dL (ref 0.44–1.00)
Glucose, Bld: 101 mg/dL — ABNORMAL HIGH (ref 65–99)
POTASSIUM: 3.9 mmol/L (ref 3.5–5.1)
Sodium: 140 mmol/L (ref 135–145)
Total Bilirubin: 0.2 mg/dL — ABNORMAL LOW (ref 0.3–1.2)
Total Protein: 6.7 g/dL (ref 6.5–8.1)

## 2015-07-08 LAB — URINALYSIS, ROUTINE W REFLEX MICROSCOPIC
Bilirubin Urine: NEGATIVE
GLUCOSE, UA: NEGATIVE mg/dL
HGB URINE DIPSTICK: NEGATIVE
Ketones, ur: NEGATIVE mg/dL
Leukocytes, UA: NEGATIVE
Nitrite: NEGATIVE
PH: 7.5 (ref 5.0–8.0)
PROTEIN: NEGATIVE mg/dL
Specific Gravity, Urine: 1.009 (ref 1.005–1.030)
Urobilinogen, UA: 0.2 mg/dL (ref 0.0–1.0)

## 2015-07-08 LAB — CBC
HCT: 35.7 % — ABNORMAL LOW (ref 36.0–46.0)
Hemoglobin: 11 g/dL — ABNORMAL LOW (ref 12.0–15.0)
MCH: 24.2 pg — AB (ref 26.0–34.0)
MCHC: 30.8 g/dL (ref 30.0–36.0)
MCV: 78.6 fL (ref 78.0–100.0)
Platelets: 179 10*3/uL (ref 150–400)
RBC: 4.54 MIL/uL (ref 3.87–5.11)
RDW: 14.8 % (ref 11.5–15.5)
WBC: 4.7 10*3/uL (ref 4.0–10.5)

## 2015-07-08 LAB — CBG MONITORING, ED: GLUCOSE-CAPILLARY: 71 mg/dL (ref 65–99)

## 2015-07-08 LAB — TSH: TSH: 2.453 u[IU]/mL (ref 0.350–4.500)

## 2015-07-08 MED ORDER — POLYVINYL ALCOHOL 1.4 % OP SOLN: Freq: Every day | OPHTHALMIC | Status: DC | PRN

## 2015-07-08 MED ORDER — MIRTAZAPINE 7.5 MG PO TABS
7.5000 mg | ORAL_TABLET | Freq: Every day | ORAL | Status: DC
  Administered 2015-07-09: 7.5 mg via ORAL
  Filled 2015-07-08 (×2): qty 1

## 2015-07-08 MED ORDER — ARFORMOTEROL TARTRATE 15 MCG/2ML IN NEBU
15.0000 ug | INHALATION_SOLUTION | Freq: Two times a day (BID) | RESPIRATORY_TRACT | Status: DC
  Filled 2015-07-08 (×3): qty 2

## 2015-07-08 MED ORDER — BOOST PO LIQD
237.0000 mL | Freq: Three times a day (TID) | ORAL | Status: DC
  Filled 2015-07-08: qty 237

## 2015-07-08 MED ORDER — MEGESTROL ACETATE 400 MG/10ML PO SUSP
800.0000 mg | Freq: Every day | ORAL | Status: DC
Start: 2015-07-09 — End: 2015-07-09
  Filled 2015-07-08: qty 20

## 2015-07-08 MED ORDER — ALBUTEROL SULFATE (2.5 MG/3ML) 0.083% IN NEBU: 2.5000 mg | INHALATION_SOLUTION | Freq: Four times a day (QID) | RESPIRATORY_TRACT | Status: DC | PRN

## 2015-07-08 MED ORDER — LORATADINE 10 MG PO TABS
10.0000 mg | ORAL_TABLET | Freq: Every day | ORAL | Status: DC
  Filled 2015-07-08: qty 1

## 2015-07-08 MED ORDER — BENZONATATE 100 MG PO CAPS
200.0000 mg | ORAL_CAPSULE | Freq: Two times a day (BID) | ORAL | Status: DC
  Administered 2015-07-09: 200 mg via ORAL
  Filled 2015-07-08 (×2): qty 2

## 2015-07-08 MED ORDER — IPRATROPIUM BROMIDE 0.02 % IN SOLN: 0.5000 mg | Freq: Four times a day (QID) | RESPIRATORY_TRACT | Status: DC | PRN

## 2015-07-08 MED ORDER — TIOTROPIUM BROMIDE MONOHYDRATE 18 MCG IN CAPS
18.0000 ug | ORAL_CAPSULE | Freq: Every day | RESPIRATORY_TRACT | Status: DC
  Filled 2015-07-08: qty 5

## 2015-07-08 MED ORDER — FOLIC ACID 1 MG PO TABS
1.0000 mg | ORAL_TABLET | Freq: Every morning | ORAL | Status: DC
  Filled 2015-07-08: qty 1

## 2015-07-08 MED ORDER — BUDESONIDE 0.5 MG/2ML IN SUSP
0.5000 mg | Freq: Two times a day (BID) | RESPIRATORY_TRACT | Status: DC
  Filled 2015-07-08 (×3): qty 2

## 2015-07-08 MED ORDER — MEMANTINE HCL ER 28 MG PO CP24
28.0000 mg | ORAL_CAPSULE | Freq: Every day | ORAL | Status: DC
  Filled 2015-07-08: qty 1

## 2015-07-08 MED ORDER — CHOLECALCIFEROL 10 MCG (400 UNIT) PO TABS
400.0000 [IU] | ORAL_TABLET | Freq: Every day | ORAL | Status: DC
  Filled 2015-07-08: qty 1

## 2015-07-08 MED ORDER — FAMOTIDINE 20 MG PO TABS
20.0000 mg | ORAL_TABLET | Freq: Two times a day (BID) | ORAL | Status: DC
  Administered 2015-07-09: 20 mg via ORAL
  Filled 2015-07-08 (×2): qty 1

## 2015-07-08 MED ORDER — ALBUTEROL SULFATE HFA 108 (90 BASE) MCG/ACT IN AERS: 2.0000 | INHALATION_SPRAY | Freq: Four times a day (QID) | RESPIRATORY_TRACT | Status: DC | PRN

## 2015-07-08 NOTE — ED Notes (Signed)
Dinner tray placed at bedside.

## 2015-07-08 NOTE — ED Notes (Signed)
No answer in waiting room 

## 2015-07-08 NOTE — ED Notes (Signed)
Jody, CSW advised patient will be staying in the ED overnight. Safety sitter called for patient since she continues to get out of bed when family is not at the bedside.

## 2015-07-08 NOTE — ED Notes (Signed)
[phone] Tammy, patients daughter.

## 2015-07-08 NOTE — Clinical Social Work Note (Signed)
Clinical Social Work Assessment  Patient Details  Name: [name] MRN: [id number] Date of Birth: December 06, 1940  Date of referral:  07/08/15               Reason for consult:  Facility Placement, Medication Concerns, [employer], Discharge Planning                Permission sought to share information with:  Case Freight forwarder, Customer service manager, Landscape architect granted to share information::  Yes, Verbal Permission Granted  Name::        Agency::  SNF search  Relationship::  Daughter: at the bedside  Contact Information:     Housing/Transportation Living arrangements for the past 2 months:  Single Family Home Source of Information:  Medical Team, Adult Children Patient Interpreter Needed:  None Criminal Activity/Legal Involvement Pertinent to Current Situation/Hospitalization:  No - Comment as needed Significant Relationships:  Adult Children, Other Family Members Lives with:    Daughter and Granddaughter Do you feel safe going back to the place where you live?  No (needing PT consult as patient has increased weakness, dementia, hard to handle at home) Need for family participation in patient care:  Yes (Comment) (patient with known Dementia)  Care giving concerns:  Met with patient's daughter at the bedside that reports patient recently has been decompensating with memory and physical ability. Patient with known dx of Dementia, had a recent medication adjustment and has not be responding well. Patient has become more agitated, hiding her O2, combative, and confused with delusions of being in dentist office, and seeing people.  Daughter reports she works and her she cannot be with mother on a daily basis, thus other family has helped, but unable to manage patient needs in the home.  No evidence of current medical problem other than SOB when she does not where her O2.  Daughter reports she is fearful patient is going to fall or hurt someone if she continues to  remain at home.   Prior to ED admission, daughter was actively working with PCP to assist with ALF/SNF placement along with [facility] for medicaid information.   Social Worker assessment / plan:  Met with daughter and patient. Daughter explained situation as listed above.  Daughter is not refusing to take patient home, just wanting to understand options with mother and obtain information about SNF, if patient qualifies, or ALF and process.  Home care might also be in option for patient if she meets criteria. Discussed case with MD asked for PT consult and Psych consult due to behaviors with medication adjustments in the last 2 weeks and PT to understand patient's baseline.   If PT recommends SNF, LCSW will submit for insurance authorization and attempt to place patient from the hospital. If ALF is recommended, family is unable to pay privately at this time, but LCSW will connect them with resources in effort to work to be placed from home and apply for medicaid.    Employment status:  Retired Nurse, adult PT Recommendations:  Not assessed at this time (consult placed for the ED) Information / Referral to community resources:  Adult Herbalist, Event organiser, Other (Comment Required) (ALF, Home Health, Respite, medicaid)  Patient/Family's Response to care:  Agreeable to plan  Patient/Family's Understanding of and Emotional Response to Diagnosis, Current Treatment, and Prognosis:  Family is very agreeable to plan, understands barriers to placement and hopeful for recommendation for assistance with placement.    Emotional Assessment Appearance:  Appears stated age Attitude/Demeanor/Rapport:  Guarded, Lethargic Affect (typically observed):  Agitated, Frustrated Orientation:  Oriented to Self Alcohol / Substance use:  Not Applicable Psych involvement (Current and /or in the community):  Yes (Comment) (requested a psych consult for medication management)  Discharge  Needs  Concerns to be addressed:  Discharge Planning Concerns, Financial / Insurance Concerns, Medication Concerns Readmission within the last 30 days:  Yes Current discharge risk:  Cognitively Impaired, Other (increased behaviors associated with Dementia, unable to care for patient) Barriers to Discharge:  Insurance Authorization (SNF referral vs ALF referral for ?placement)   [place], LCSW 07/08/2015, 9:00 AM

## 2015-07-08 NOTE — ED Notes (Signed)
Social Work to speak with pt

## 2015-07-08 NOTE — ED Notes (Signed)
Patient's daughter back at bedside

## 2015-07-08 NOTE — ED Notes (Signed)
Per [place], Consulting civil engineer pt to remain in A2 at this time

## 2015-07-08 NOTE — ED Notes (Signed)
Patient attempting to get out of bed and unhook herself from bp and o2 as well as oxygen. Pt helped back into her bed, given call light, pt appeared fidgety, messing with her O2 sensor. This RN provided patient with magazine to help her to have something to do while waiting in her room.

## 2015-07-08 NOTE — Progress Notes (Addendum)
Pt has been accepted at [facility].  Pt will need MD to generate a med list so that the NH can order her meds for her.  Patient has not been getting any meds while in the hospital.  Xanax and Ativan tapers have been suggested for pt by the Psych service, with dosing insrtuctions for the Ativan, but not the Xanax.  Additionally, Remeron has also been suggested.  CSW will discuss with MD/RN.  Spoke with Dr. Rubin Payor who was agreeable to order pt's home meds.  Dayshift CSW will f/u and facilitate NH tx in the am.  Pt's daughter is aware of d/c plan and plans on being here at 7:30 am.  Daughter will provide transport for pt to NH.

## 2015-07-08 NOTE — Evaluation (Signed)
Physical Therapy Evaluation Patient Details Name: [name] MRN: [id number] DOB: 09/07/1941 Today's Date: 07/08/2015   History of Present Illness  Pt adm with worsening confusion and weakness. PMH - dementia, copd  Clinical Impression  Pt presents to PT with below deficits. Pt requiring assist for mobility due to weakness and poor balance. Recommend ST-SNF to maximize independence and safety.      Follow Up Recommendations SNF    Equipment Recommendations  None recommended by PT    Recommendations for Other Services       Precautions / Restrictions Restrictions Weight Bearing Restrictions: No      Mobility  Bed Mobility Overal bed mobility: Needs Assistance Bed Mobility: Supine to Sit;Sit to Supine     Supine to sit: Supervision Sit to supine: Min assist   General bed mobility comments: Assist to bring legs back up into bed  Transfers Overall transfer level: Needs assistance Equipment used: 1 person hand held assist Transfers: Sit to/from Stand Sit to Stand: Min assist         General transfer comment: Assist for balance  Ambulation/Gait Ambulation/Gait assistance: Min assist Ambulation Distance (Feet): 120 Feet Assistive device: 1 person hand held assist Gait Pattern/deviations: Step-through pattern;Decreased stride length;Staggering left;Staggering right;Drifts right/left     General Gait Details: assist for balance and support  Stairs            Wheelchair Mobility    Modified Rankin (Stroke Patients Only)       Balance Overall balance assessment: Needs assistance Sitting-balance support: No upper extremity supported;Feet supported Sitting balance-Leahy Scale: Good     Standing balance support: Single extremity supported Standing balance-Leahy Scale: Poor                               Pertinent Vitals/Pain Pain Assessment: No/denies pain    Home Living Family/patient expects to be discharged to:: Private  residence Living Arrangements: Children Available Help at Discharge: Family;Available PRN/intermittently   Home Access: Stairs to enter   Entrance Stairs-Number of Steps: 2   Home Equipment: None      Prior Function Level of Independence: Needs assistance   Gait / Transfers Assistance Needed: amb independently without assistive device           Hand Dominance   Dominant Hand: Right    Extremity/Trunk Assessment   Upper Extremity Assessment: Generalized weakness           Lower Extremity Assessment: Generalized weakness         Communication   Communication: No difficulties  Cognition Arousal/Alertness: Awake/alert Behavior During Therapy: WFL for tasks assessed/performed Overall Cognitive Status: History of cognitive impairments - at baseline                      General Comments      Exercises        Assessment/Plan    PT Assessment All further PT needs can be met in the next venue of care  PT Diagnosis Difficulty walking;Generalized weakness   PT Problem List Decreased strength;Decreased activity tolerance;Decreased balance;Decreased mobility;Decreased safety awareness;Decreased knowledge of precautions  PT Treatment Interventions     PT Goals (Current goals can be found in the Care Plan section) Acute Rehab PT Goals Patient Stated Goal: not stated    Frequency     Barriers to discharge        Co-evaluation                 End of Session Equipment Utilized During Treatment: Gait belt;Oxygen Activity Tolerance: Patient tolerated treatment well Patient left: in bed;with call bell/phone within reach;with family/visitor present Nurse Communication: Mobility status    Functional Assessment Tool Used: clinical judgement Functional Limitation: Mobility: Walking and moving around Mobility: Walking and Moving Around Current Status (E5631): At least 20 percent but less than 40 percent impaired, limited or restricted Mobility:  Walking and Moving Around Goal Status [phone]): At least 1 percent but less than 20 percent impaired, limited or restricted Mobility: Walking and Moving Around Discharge Status [phone]): At least 20 percent but less than 40 percent impaired, limited or restricted    Time: 1025-1040 PT Time Calculation (min) (ACUTE ONLY): 15 min   Charges:   PT Evaluation $Initial PT Evaluation Tier I: 1 Procedure     PT G Codes:   PT G-Codes **NOT FOR INPATIENT CLASS** Functional Assessment Tool Used: clinical judgement Functional Limitation: Mobility: Walking and moving around Mobility: Walking and Moving Around Current Status (Y8502): At least 20 percent but less than 40 percent impaired, limited or restricted Mobility: Walking and Moving Around Goal Status [phone]): At least 1 percent but less than 20 percent impaired, limited or restricted Mobility: Walking and Moving Around Discharge Status [phone]): At least 20 percent but less than 40 percent impaired, limited or restricted    [facility] 07/08/2015, 11:02 AM [place]

## 2015-07-08 NOTE — ED Notes (Signed)
Family c/o worsening dementia in pt - states "she keeps taking off oxygen tubing and hiding it at home." Pt cooperative at this time w/ O2 sats at 100% [place] 3L.

## 2015-07-08 NOTE — ED Notes (Signed)
Pt placed on bedpan - attempted to obtain urine specimen but unsuccessful.

## 2015-07-08 NOTE — ED Notes (Signed)
EDP at bedside  

## 2015-07-08 NOTE — ED Notes (Signed)
Paged SW to [employer]

## 2015-07-08 NOTE — ED Notes (Signed)
Social work bedside speaking with pt and daughter.

## 2015-07-08 NOTE — ED Provider Notes (Signed)
CSN: [id number]     Arrival date & time 07/07/15  2214 History  This chart was scribed for Jodi Kaplan, MD by [name], ED Scribe. This patient was seen in room A02C/A02C and the patient's care was started at 3:42 AM.     Chief Complaint  Patient presents with  . Dementia   The history is provided by the patient and a relative. No language interpreter was used.    HPI Comments: [name] is a 74 y.o. female with hx of dementia (dx 2012) who presents to the Emergency Department complaining of increased AMS onset 2 weeks ago. Daughter notes that the pt is not eating or drinking normally, and has been exhibiting weakness. Per family, the pt has been acting more confused lately, and believe that she is danger to herself due to her actions. They note that she sometimes forgets who her children are, sees/talks to dead people, takes her oxygen tube off, ties her tube, places it in odd locations or hides it. Daughter notes that she is occasionally oriented and aware to her surroundings. Pt lives with her daughter and does not have a home care nurse. She is ambulatory at baseline. PCP is Dr. Allyne Gee. They deny recent falls, dysuria, hematuria, vomiting, diarrhea, fever.   Past Medical History  Diagnosis Date  . COPD (chronic obstructive pulmonary disease)   . 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 H/O ETOH abuse   . Oxygen dependent     2l per nasal cannula  . GERD (gastroesophageal reflux disease)   . Osteopenia   . Arthritis   . Dementia   . Cataracts, bilateral   . Hx of blood clots   . Amnesia    Past Surgical History  Procedure Laterality Date  . Hip surgery    . Fracture surgery    . Cataract extraction w/phaco Right 01/08/2013    Procedure: PHACOEMULSIFICATION, POSTERIOR CHAMBER INTRAOCULAR LENS PLACEMENT;  Surgeon: Shade Flood, MD;  Location: [facility] OR;  Service: Ophthalmology;  Laterality: Right;  ATTEMPTED LOCAL ANESTHESIA; PATIENT RE-PREPPED AND DRAPED FOR GENERAL ANESTHESIA   Family  History  Problem Relation Age of Onset  . Emphysema Sister     smoker  . COPD Sister   . Alzheimer's disease Sister   . Asthma Daughter   . Heart disease Father   . Deep vein thrombosis Daughter   . COPD Mother   . Alzheimer's disease Mother   . Heart disease Brother   . Alzheimer's disease Sister   . COPD Sister    Social History  Substance Use Topics  . Smoking status: Former Smoker -- 0.50 packs/day for 50 years    Types: Cigarettes    Quit date: 02/21/2011  . Smokeless tobacco: Never Used  . Alcohol Use: No     Comment: H/O abuse.  None now.   OB History    No data available     Review of Systems  Constitutional: Positive for appetite change. Negative for fever.  Gastrointestinal: Negative for vomiting and diarrhea.  Genitourinary: Negative for dysuria and hematuria.  Neurological: Positive for weakness.  Psychiatric/Behavioral: Positive for hallucinations and confusion.  A complete 10 system review of systems was obtained and all systems are negative except as noted in the HPI and PMH.   Allergies  Review of patient's allergies indicates no known allergies.  Home Medications   Prior to Admission medications   Medication Sig Start Date End Date Taking? Authorizing Provider  albuterol (PROAIR HFA) 108 (90  BASE) MCG/ACT inhaler Inhale 2 puffs into the lungs every 6 (six) hours as needed for wheezing or shortness of breath.    Yes Historical Provider, MD  albuterol (PROVENTIL) (2.5 MG/3ML) 0.083% nebulizer solution Take 2.5 mg by nebulization every 6 (six) hours as needed for shortness of breath.    Yes Historical Provider, MD  ALPRAZolam (XANAX) 0.25 MG tablet Take 1 tablet (0.25 mg total) by mouth at bedtime. Patient taking differently: Take 0.25 mg by mouth 3 (three) times daily.  05/29/13  Yes [name] A [name], MD  arformoterol (BROVANA) 15 MCG/2ML NEBU Take 15 mcg by nebulization 2 (two) times daily.     Yes Historical Provider, MD  benzonatate (TESSALON) 100 MG  capsule Take 200 mg by mouth 2 (two) times daily.    Yes Historical Provider, MD  budesonide (PULMICORT) 0.5 MG/2ML nebulizer solution Take 0.5 mg by nebulization 2 (two) times daily.    Yes Historical Provider, MD  cetirizine (ZYRTEC) 10 MG tablet Take 10 mg by mouth every morning.    Yes Historical Provider, MD  Cholecalciferol (VITAMIN D PO) Take by mouth daily.   Yes Historical Provider, MD  famotidine (PEPCID) 20 MG tablet Take 20 mg by mouth 2 (two) times daily.    Yes Historical Provider, MD  FERROUS SULFATE PO Take by mouth daily.   Yes Historical Provider, MD  folic acid (FOLVITE) 1 MG tablet Take 1 mg by mouth every morning.    Yes Historical Provider, MD  ipratropium (ATROVENT HFA) 17 MCG/ACT inhaler Inhale 2 puffs into the lungs every 6 (six) hours as needed.    Yes Historical Provider, MD  lactose free nutrition (BOOST) LIQD Take 237 mLs by mouth 3 (three) times daily between meals.   Yes Historical Provider, MD  megestrol (MEGACE) 400 MG/10ML suspension Take 20 mLs (800 mg total) by mouth daily. 04/22/15  Yes York Spaniel, MD  meloxicam (MOBIC) 7.5 MG tablet Take 7.5 mg by mouth daily.   Yes Historical Provider, MD  memantine (NAMENDA XR) 28 MG CP24 24 hr capsule Take 28 mg by mouth daily.   Yes Historical Provider, MD  Multiple Vitamin (MULTIVITAMIN) tablet Take 1 tablet by mouth daily.   Yes Historical Provider, MD  Polyethyl Glycol-Propyl Glycol (SYSTANE OP) Place 1 drop into the right eye daily as needed (for dry eyes).    Yes Historical Provider, MD  tiotropium (SPIRIVA) 18 MCG inhalation capsule Place 18 mcg into inhaler and inhale every morning.    Yes Historical Provider, MD  feeding supplement (ENSURE COMPLETE) LIQD Take 237 mLs by mouth 2 (two) times daily between meals. Patient not taking: Reported on 06/21/2015 05/29/13   [name] A [name], MD   BP 110/74 mmHg  Pulse 105  Temp(Src) 98.6 F (37 C) (Oral)  Resp 20  Ht  (1.6 m)  Wt 89 lb 1.1 oz (40.4 kg)  BMI  15.78 kg/m2  SpO2 96% Physical Exam  Constitutional: She is oriented to person, place, and time. She appears well-developed and well-nourished.  HENT:  Head: Normocephalic and atraumatic.  Eyes: Conjunctivae and EOM are normal. Pupils are equal, round, and reactive to light.  Neck: Normal range of motion. Neck supple.  Cardiovascular: Normal rate, regular rhythm and normal heart sounds.   Pulmonary/Chest: Effort normal and breath sounds normal. No respiratory distress. She has no wheezes. She has no rhonchi.  Lungs CTA bilaterally.   Abdominal: Soft. Bowel sounds are normal. She exhibits no distension.  Musculoskeletal: Normal range of  motion.  Neurological: She is alert and oriented to person, place, and time.  Skin: Skin is warm and dry.  Psychiatric: She has a normal mood and affect. Her behavior is normal.  Nursing note and vitals reviewed.   ED Course  Procedures (including critical care time) DIAGNOSTIC STUDIES: Oxygen Saturation is 98% on RA, normal by my interpretation.    COORDINATION OF CARE: 5:49 AM Discussed treatment plan with pt at bedside and pt agreed to plan.   Labs Review Labs Reviewed  COMPREHENSIVE METABOLIC PANEL - Abnormal; Notable for the following:    Glucose, Bld 101 (*)    BUN 22 (*)    ALT 13 (*)    Total Bilirubin 0.2 (*)    All other components within normal limits  CBC - Abnormal; Notable for the following:    Hemoglobin 11.0 (*)    HCT 35.7 (*)    MCH 24.2 (*)    All other components within normal limits  URINE CULTURE  URINALYSIS, ROUTINE W REFLEX MICROSCOPIC (NOT AT [facility])  TSH  CBG MONITORING, ED    Imaging Review Dg Chest Portable 1 View  07/08/2015   CLINICAL DATA:  Hypoxia.  History of COPD.  EXAM: PORTABLE CHEST - 1 VIEW  COMPARISON:  06/21/2015  FINDINGS: Lungs are hyperinflated with emphysema. Cardiomediastinal contours are unchanged. Pulmonary vasculature is normal. No consolidation, pleural effusion, or pneumothorax. No acute  osseous abnormalities are seen. The bones are under mineralized. Multiple overlying monitoring devices partially obscure evaluation.  IMPRESSION: No acute pulmonary process.   Electronically Signed   By: Rubye Oaks M.D.   On: 07/08/2015 05:34   I have personally reviewed and evaluated these images and lab results as part of my medical decision-making.   EKG Interpretation None      MDM   Final diagnoses:  Dementia, with behavioral disturbance  COPD (chronic obstructive pulmonary disease) with chronic bronchitis    I personally performed the services described in this documentation, which was scribed in my presence. The recorded information has been reviewed and is accurate.  Pt comes in with cc of worsening dementia. Labs are reassuring. Will get SW on board.  Pt lives with daughter, who has a POA. She reports that patient has been not keeping her oxygen on, and has been more delusional and hallucination and her memory is getting worse. No falls that she is aware of. She has informed pcp as well about this. There is no home help, and she is missing too may work days due to her mother's dementia and cant afford that.   Jodi Kaplan, MD 07/08/15 [phone]

## 2015-07-08 NOTE — Progress Notes (Addendum)
1418:  Patient has been declined from [facility] due to high acuity on the unit.  Call placed to [facility] [place] who also offered pending insurance. Rep for [facility] is running patient by buildings and will follow up.   Daughter went to get some lunch will be back and will be updated.  LCSW spoke with MD, reviewed two consults and recommendations have been made. Currently working on SNF placement. Completing FL2 Completing referral and call the [facility].  Updated family about plan. Awaiting review from insurance and SNF regarding if accepted to SNF.  [name], MSW Clinical Social Work: Emergency Room [phone]

## 2015-07-08 NOTE — ED Notes (Signed)
Pt attempting to get out of her bed and take off her bp cuff and O2 sensor, this RN went into room to redirect patient back into her bed. bp cuff and O2 sensor placed back on patient and patient advised why she needed to keep them on.

## 2015-07-08 NOTE — ED Notes (Addendum)
Safety sitter has pt within sight. Daughter bedside. Pt resting.

## 2015-07-08 NOTE — Clinical Social Work Placement (Addendum)
   CLINICAL SOCIAL WORK PLACEMENT  NOTE  Date:  07/08/2015  Patient Details  Name: [name] MRN: [id number] Date of Birth: 01-28-1941  Clinical Social Work is seeking post-discharge placement for this patient at the Skilled  Nursing Facility level of care (*CSW will initial, date and re-position this form in  chart as items are completed):  Yes   Patient/family provided with [place] Clinical Social Work Department's list of facilities offering this level of care within the geographic area requested by the patient (or if unable, by the patient's family).  Yes   Patient/family informed of their freedom to choose among providers that offer the needed level of care, that participate in Medicare, Medicaid or managed care program needed by the patient, have an available bed and are willing to accept the patient.  Yes   Patient/family informed of [place]'s ownership interest in [facility] and [facility], as well as of the fact that they are under no obligation to receive care at these facilities.  PASRR submitted to EDS on       PASRR number received on       Existing PASRR number confirmed on 07/08/15     FL2 transmitted to all facilities in geographic area requested by pt/family on 07/08/15     FL2 transmitted to all facilities within larger geographic area on       Patient informed that his/her managed care company has contracts with or will negotiate with certain facilities, including the following:      Yes      Patient/family informed of bed offers received. Yes 07/09/2015  Patient chooses bed at     [facility]  Physician recommends and patient chooses bed at     SNF Patient to be transferred to   on  .  07/09/2015  Patient to be transferred to facility by     Family private vehicle  Patient family notified on   of transfer. Daughter who is in room taking patient  Name of family member notified:      Tammy PHYSICIAN Please prepare  priority discharge summary, including medications, Please prepare prescriptions, Please sign FL2     Additional Comment:    _______________________________________________ Raye Sorrow, LCSW 07/08/2015, 12:47 PM

## 2015-07-08 NOTE — ED Notes (Signed)
No answer in waiting room x3

## 2015-07-08 NOTE — Consult Note (Signed)
  EDP requested medication recommendation for patient.    After reviewing patient's chart and discussing patient with Dr. Jama Flavors the following were recommended for medication changes  1. Taper Xanax until discontinued related to Xanax causing worsening cognition and increasing fall risk and not the ideal medication for the elderly  2.  Patient can be put on an Ativan taper until discontinued which will prevent benzodiazapine withdrawal and start patient on Remeron 7.5 Q hs for anxiety/depression/insomnia.    Spoke with Dr. Elwin Mocha [facility] EDP) informed of medication recommendation.   Ativan 0.25 mg (1/2 tablet) bid for 5 days Decrease to 0.25 mg (1/2 tablet) for 5 days Decrease to 0.25 mg (1/2 tablet) QOD for 5 days Decrease to 0.25 mg (1/2 tablet) Q 3 days for 1 week then Q 4 days for 1 week then discontinue  And   Remeron 7.5 mg Q hs   Shuvon B. Rankin FNP-BC  Patient discussed with me as above  Nehemiah Massed, MD

## 2015-07-08 NOTE — ED Notes (Signed)
Burna Mortimer, Case Manager advised there is still no word on whether patient is going to get placement at a SNF. Advised that there may be more information about patient getting placement around 1900.

## 2015-07-08 NOTE — ED Notes (Signed)
Breakfast tray ordered 

## 2015-07-09 DIAGNOSIS — Z87891 Personal history of nicotine dependence: Secondary | ICD-10-CM | POA: Diagnosis not present

## 2015-07-09 DIAGNOSIS — K219 Gastro-esophageal reflux disease without esophagitis: Secondary | ICD-10-CM | POA: Diagnosis not present

## 2015-07-09 DIAGNOSIS — Z79899 Other long term (current) drug therapy: Secondary | ICD-10-CM | POA: Diagnosis not present

## 2015-07-09 DIAGNOSIS — Z791 Long term (current) use of non-steroidal anti-inflammatories (NSAID): Secondary | ICD-10-CM | POA: Diagnosis not present

## 2015-07-09 DIAGNOSIS — H269 Unspecified cataract: Secondary | ICD-10-CM | POA: Diagnosis not present

## 2015-07-09 DIAGNOSIS — R4182 Altered mental status, unspecified: Secondary | ICD-10-CM | POA: Diagnosis not present

## 2015-07-09 DIAGNOSIS — J449 Chronic obstructive pulmonary disease, unspecified: Secondary | ICD-10-CM | POA: Diagnosis not present

## 2015-07-09 DIAGNOSIS — M1990 Unspecified osteoarthritis, unspecified site: Secondary | ICD-10-CM | POA: Diagnosis not present

## 2015-07-09 DIAGNOSIS — E43 Unspecified severe protein-calorie malnutrition: Secondary | ICD-10-CM | POA: Diagnosis not present

## 2015-07-09 DIAGNOSIS — F1010 Alcohol abuse, uncomplicated: Secondary | ICD-10-CM | POA: Diagnosis not present

## 2015-07-09 DIAGNOSIS — J302 Other seasonal allergic rhinitis: Secondary | ICD-10-CM | POA: Diagnosis not present

## 2015-07-09 DIAGNOSIS — Z9981 Dependence on supplemental oxygen: Secondary | ICD-10-CM | POA: Diagnosis not present

## 2015-07-09 DIAGNOSIS — J9610 Chronic respiratory failure, unspecified whether with hypoxia or hypercapnia: Secondary | ICD-10-CM | POA: Diagnosis not present

## 2015-07-09 DIAGNOSIS — Z8719 Personal history of other diseases of the digestive system: Secondary | ICD-10-CM | POA: Diagnosis not present

## 2015-07-09 DIAGNOSIS — F0390 Unspecified dementia without behavioral disturbance: Secondary | ICD-10-CM | POA: Diagnosis not present

## 2015-07-09 DIAGNOSIS — F0391 Unspecified dementia with behavioral disturbance: Secondary | ICD-10-CM | POA: Diagnosis not present

## 2015-07-09 DIAGNOSIS — M150 Primary generalized (osteo)arthritis: Secondary | ICD-10-CM | POA: Diagnosis not present

## 2015-07-09 DIAGNOSIS — J42 Unspecified chronic bronchitis: Secondary | ICD-10-CM | POA: Diagnosis not present

## 2015-07-09 LAB — URINE CULTURE: Culture: NO GROWTH

## 2015-07-09 MED ORDER — LORAZEPAM 0.5 MG PO TABS: ORAL_TABLET | ORAL | Status: DC

## 2015-07-09 MED ORDER — LORAZEPAM 1 MG PO TABS: ORAL_TABLET | ORAL | Status: DC

## 2015-07-09 NOTE — ED Notes (Signed)
PT continues to sleep; daughter at bedside. No signs of distres.

## 2015-07-09 NOTE — ED Notes (Signed)
Pt resting, daughter and sitter at bedside.  Awaiting pt to awaken for discharge to [facility].  Daughter has agreed to take pt there.

## 2015-07-09 NOTE — ED Notes (Signed)
PT's daughter at bedside; pt continues to rest and daughter states she "needs to bc she has been up for days". Breathing unlabored. No signs of distress.

## 2015-07-09 NOTE — ED Notes (Signed)
SW at bedside talking to daughter.

## 2015-07-09 NOTE — ED Notes (Signed)
Lunch meal delivered.

## 2015-07-09 NOTE — Progress Notes (Signed)
LCSW has discussed care plan with AD of SW, Medical Director Reynaldo Minium, daughter, SNF and RN.  Patient plan at this time is to DC to [facility] [place].  Tammy from [place] will be coming to see patient and daughter, discuss insurance authorization as it may be very short term and options for the family.  Medicaid is also needed to be started and packet was printed for daughter to complete and drop off today.  Medications are solidified and recommendations have been made to DC Xanax, taper to Ativan, eventually off and start Remeron.  Again these are recommendations as patient has not responded well at home with the increase of Xanax.    LCSW will met with daughter and SNF at 10am.    Daughter can transport to SNF once everything has been finalized.  Lane Hacker, MSW Clinical Social Work: Emergency Room [phone]

## 2015-07-09 NOTE — ED Notes (Signed)
NAD at this time. Pt is leaving with her daughters.

## 2015-07-09 NOTE — ED Notes (Signed)
Pt ambulating in room, taking off oxygen and refusing to let tech help assist her back to bed. This RN calmly spoke with pt and encouraged to wear oxygen and assisted pt to lay down in bed. Pt now resting comfortably in bed, wearing 3L oxygen.

## 2015-07-09 NOTE — ED Notes (Signed)
Patient is resting comfortably, with sitter at the bedside. 

## 2015-07-09 NOTE — ED Notes (Signed)
Pt resting; sitter at bedside. 

## 2015-07-09 NOTE — ED Notes (Signed)
Pt continues to rest; daughter at bedside does not want her disturbed.

## 2015-07-12 ENCOUNTER — Non-Acute Institutional Stay (SKILLED_NURSING_FACILITY): Payer: Medicare Other | Admitting: Internal Medicine

## 2015-07-12 DIAGNOSIS — M150 Primary generalized (osteo)arthritis: Secondary | ICD-10-CM | POA: Diagnosis not present

## 2015-07-12 DIAGNOSIS — F1010 Alcohol abuse, uncomplicated: Secondary | ICD-10-CM | POA: Diagnosis not present

## 2015-07-12 DIAGNOSIS — E43 Unspecified severe protein-calorie malnutrition: Secondary | ICD-10-CM

## 2015-07-12 DIAGNOSIS — K219 Gastro-esophageal reflux disease without esophagitis: Secondary | ICD-10-CM

## 2015-07-12 DIAGNOSIS — J449 Chronic obstructive pulmonary disease, unspecified: Secondary | ICD-10-CM

## 2015-07-12 DIAGNOSIS — J9612 Chronic respiratory failure with hypercapnia: Secondary | ICD-10-CM

## 2015-07-12 DIAGNOSIS — J302 Other seasonal allergic rhinitis: Secondary | ICD-10-CM | POA: Diagnosis not present

## 2015-07-12 DIAGNOSIS — F1011 Alcohol abuse, in remission: Secondary | ICD-10-CM

## 2015-07-12 DIAGNOSIS — F1027 Alcohol dependence with alcohol-induced persisting dementia: Secondary | ICD-10-CM

## 2015-07-12 DIAGNOSIS — M159 Polyosteoarthritis, unspecified: Secondary | ICD-10-CM

## 2015-07-12 DIAGNOSIS — J9611 Chronic respiratory failure with hypoxia: Secondary | ICD-10-CM

## 2015-07-29 ENCOUNTER — Encounter: Payer: Self-pay | Admitting: Internal Medicine

## 2015-08-11 ENCOUNTER — Non-Acute Institutional Stay (SKILLED_NURSING_FACILITY): Payer: Medicare Other | Admitting: Adult Health

## 2015-08-11 DIAGNOSIS — E43 Unspecified severe protein-calorie malnutrition: Secondary | ICD-10-CM | POA: Diagnosis not present

## 2015-08-11 DIAGNOSIS — J302 Other seasonal allergic rhinitis: Secondary | ICD-10-CM

## 2015-08-11 DIAGNOSIS — J42 Unspecified chronic bronchitis: Secondary | ICD-10-CM

## 2015-08-11 DIAGNOSIS — M159 Polyosteoarthritis, unspecified: Secondary | ICD-10-CM

## 2015-08-11 DIAGNOSIS — F418 Other specified anxiety disorders: Secondary | ICD-10-CM

## 2015-08-11 DIAGNOSIS — J9610 Chronic respiratory failure, unspecified whether with hypoxia or hypercapnia: Secondary | ICD-10-CM | POA: Diagnosis not present

## 2015-08-11 DIAGNOSIS — M150 Primary generalized (osteo)arthritis: Secondary | ICD-10-CM

## 2015-08-11 DIAGNOSIS — K219 Gastro-esophageal reflux disease without esophagitis: Secondary | ICD-10-CM | POA: Diagnosis not present

## 2015-08-21 ENCOUNTER — Encounter: Payer: Self-pay | Admitting: Adult Health

## 2015-08-21 DIAGNOSIS — J302 Other seasonal allergic rhinitis: Secondary | ICD-10-CM | POA: Insufficient documentation

## 2015-08-21 DIAGNOSIS — K219 Gastro-esophageal reflux disease without esophagitis: Secondary | ICD-10-CM | POA: Insufficient documentation

## 2015-08-21 DIAGNOSIS — F418 Other specified anxiety disorders: Secondary | ICD-10-CM | POA: Insufficient documentation

## 2015-08-21 DIAGNOSIS — M159 Polyosteoarthritis, unspecified: Secondary | ICD-10-CM | POA: Insufficient documentation

## 2015-08-21 NOTE — Progress Notes (Signed)
Patient ID: [name], female   DOB: Dec 11, 1940, 74 y.o.   MRN: [id number]    [facility]      No Known Allergies  Chief Complaint  Patient presents with  . Medical Management of Chronic Issues    HPI:  She is a resident of this facility being seen for the management of her chronic illnesses. Overall she is doing well. She tells me that she is feeling good. She is not voicing any complaints or concerns at this time. There are no nursing concerns at this time.    Past Medical History  Diagnosis Date  . COPD (chronic obstructive pulmonary disease) (HCC)   . 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 H/O ETOH abuse   . Oxygen dependent     2l per nasal cannula  . GERD (gastroesophageal reflux disease)   . Osteopenia   . Arthritis   . Dementia   . Cataracts, bilateral   . Hx of blood clots   . Amnesia     Past Surgical History  Procedure Laterality Date  . Hip surgery    . Fracture surgery    . Cataract extraction w/phaco Right 01/08/2013    Procedure: PHACOEMULSIFICATION, POSTERIOR CHAMBER INTRAOCULAR LENS PLACEMENT;  Surgeon: Shade FloodGreer Geiger, MD;  [facility];  Service: Ophthalmology;  Laterality: Right;  ATTEMPTED LOCAL ANESTHESIA; PATIENT RE-PREPPED AND DRAPED FOR GENERAL ANESTHESIA    VITAL SIGNS BP 144/77 mmHg  Pulse 100  Ht 5\' 2"  (1.575 m)  Wt 91 lb (41.277 kg)  BMI 16.64 kg/m2  SpO2 91%  Patient's Medications  New Prescriptions   No medications on file  Previous Medications   ALBUTEROL (PROAIR HFA) 108 (90 BASE) MCG/ACT INHALER    Inhale 2 puffs into the lungs every 6 (six) hours as needed for wheezing or shortness of breath.    ALBUTEROL (PROVENTIL) (2.5 MG/3ML) 0.083% NEBULIZER SOLUTION    Take 2.5 mg by nebulization every 6 (six) hours as needed for shortness of breath.    ARFORMOTEROL (BROVANA) 15 MCG/2ML NEBU    Take 15 mcg by nebulization 2 (two) times daily.     BENZONATATE (TESSALON) 100 MG CAPSULE    Take 200 mg by mouth 2  (two) times daily.    BUDESONIDE (PULMICORT) 0.5 MG/2ML NEBULIZER SOLUTION    Take 0.5 mg by nebulization 2 (two) times daily.    CETIRIZINE (ZYRTEC) 10 MG TABLET    Take 10 mg by mouth every morning.    CHOLECALCIFEROL (VITAMIN D PO)    Take by mouth daily.   FAMOTIDINE (PEPCID) 20 MG TABLET    Take 20 mg by mouth 2 (two) times daily.    FERROUS SULFATE PO    Take by mouth daily.   FOLIC ACID (FOLVITE) 1 MG TABLET    Take 1 mg by mouth every morning.    IPRATROPIUM (ATROVENT HFA) 17 MCG/ACT INHALER    Inhale 2 puffs into the lungs every 6 (six) hours as needed.    LACTOSE FREE NUTRITION (BOOST) LIQD    Take 240 mLs by mouth 2 (two) times daily between meals.    MEGESTROL (MEGACE) 400 MG/10ML SUSPENSION    Take 20 mLs (800 mg total) by mouth daily.   MELOXICAM (MOBIC) 7.5 MG TABLET    Take 7.5 mg by mouth daily.   MEMANTINE (NAMENDA XR) 28 MG CP24 24 HR CAPSULE    Take 28 mg by mouth daily.   MIRTAZAPINE (REMERON) 15 MG TABLET  Take 15 mg by mouth at bedtime.   MULTIPLE VITAMIN (MULTIVITAMIN) TABLET    Take 1 tablet by mouth daily.   POLYETHYL GLYCOL-PROPYL GLYCOL (SYSTANE OP)    Place 1 drop into the right eye daily as needed (for dry eyes).    TIOTROPIUM (SPIRIVA) 18 MCG INHALATION CAPSULE    Place 18 mcg into inhaler and inhale every morning.   Modified Medications   No medications on file  Discontinued Medications     SIGNIFICANT DIAGNOSTIC EXAMS  05-19-15: ct of chest: There is no evidence for a pulmonary nodule or mass in the left lung base to account for the finding on the recent chest x-ray. Likely this appearance on x-ray is related to some scarring in the lingula and overlying anterior rib end. Emphysema. Coronary artery atherosclerosis  07-08-15: chest x-ray: No acute pulmonary process.    LABS REVIEWED:   06-21-15: wbc 5.7; hgb 11.5; hct 37.1; mcv 79.1; plt 211; glucose 131; bun 27; creat 0.82; k+ 4.1; na++140 07-07-15: wbc 4.7; hgb 11.0; hct 35.7; mcv 78.6; plt 179;  glucose 101; bun 22; creat 0.70; k+ 3.9; na++140; liver normal albumin 3.6 07-08-15: tsh 2.453; urine culture: no growth  07-14-15: glucose 177; bun 24; creat 0.71; k+ 4.1; na++144     Review of Systems  Constitutional: Negative for appetite change and fatigue.  HENT: Negative for congestion.   Respiratory: Negative for cough, chest tightness and shortness of breath.   Cardiovascular: Negative for chest pain, palpitations and leg swelling.  Gastrointestinal: Negative for nausea, abdominal pain, diarrhea and constipation.  Musculoskeletal: Negative for myalgias and arthralgias.  Skin: Negative for pallor.  Neurological: Negative for dizziness.  Psychiatric/Behavioral: The patient is not nervous/anxious.       Physical Exam  Constitutional: No distress.  Frail   Eyes: Conjunctivae are normal.  Neck: Neck supple. No JVD present. No thyromegaly present.  Cardiovascular: Normal rate, regular rhythm and intact distal pulses.   Respiratory: Effort normal and breath sounds normal. No respiratory distress. She has no wheezes.  02 dependent   GI: Soft. Bowel sounds are normal. She exhibits no distension. There is no tenderness.  Musculoskeletal: She exhibits no edema.  Able to move all extremities   Lymphadenopathy:    She has no cervical adenopathy.  Neurological: She is alert.  Skin: Skin is warm and dry. She is not diaphoretic.  Psychiatric: She has a normal mood and affect.       ASSESSMENT/ PLAN:  1. COPD has chronic respiratory failure:  is 02 dependent; will continue albuterol neb every 8 hours; brovana neb treatment every 12 hours; atrovent inhaler 2 puffs every 6 hours as needed; albuterol 2 puffs every 6 hours as needed; pulmicort neb every 12 hours; tessalon 200 mg twice daily for cough; and spiriva daily   2. Allergic rhinitis: will continue zyrtec 10 mg daily   3. GERD: will continue pepcid 20 mg twice daily   4. Anemia: will continue iron daily hgb is 11.0  5.  Severe protein calorie malnutrition: her current weight is 91 pounds her albumin is 3.6. Will continue magace 800 mg daily to help with appetite will continue supplements per facility protocol and will monitor  6. 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 Osteoarthritis: is stable will continue mobic 7.5 mg daily  8. Depression/anxiety: will continue remeron 15 mg nightly and will monitor      Synthia Innocent NP [facility]  Contact [phone] Monday through Friday 8am- 5pm  After hours call [phone]

## 2015-08-24 ENCOUNTER — Encounter: Payer: Self-pay | Admitting: Internal Medicine

## 2015-08-24 ENCOUNTER — Non-Acute Institutional Stay (SKILLED_NURSING_FACILITY): Payer: Medicare Other | Admitting: Internal Medicine

## 2015-08-24 DIAGNOSIS — M159 Polyosteoarthritis, unspecified: Secondary | ICD-10-CM

## 2015-08-24 DIAGNOSIS — F418 Other specified anxiety disorders: Secondary | ICD-10-CM | POA: Diagnosis not present

## 2015-08-24 DIAGNOSIS — M150 Primary generalized (osteo)arthritis: Secondary | ICD-10-CM

## 2015-08-24 DIAGNOSIS — K219 Gastro-esophageal reflux disease without esophagitis: Secondary | ICD-10-CM | POA: Diagnosis not present

## 2015-08-24 DIAGNOSIS — F1027 Alcohol dependence with alcohol-induced persisting dementia: Secondary | ICD-10-CM

## 2015-08-24 DIAGNOSIS — J42 Unspecified chronic bronchitis: Secondary | ICD-10-CM | POA: Diagnosis not present

## 2015-08-24 DIAGNOSIS — F1097 Alcohol use, unspecified with alcohol-induced persisting dementia: Secondary | ICD-10-CM | POA: Diagnosis not present

## 2015-08-24 DIAGNOSIS — J302 Other seasonal allergic rhinitis: Secondary | ICD-10-CM

## 2015-08-24 DIAGNOSIS — F0391 Unspecified dementia with behavioral disturbance: Secondary | ICD-10-CM | POA: Diagnosis not present

## 2015-08-24 NOTE — Progress Notes (Signed)
MRN: [id number] Name: [name]  Sex: female Age: 74 y.o. DOB: Jun 11, 1941  [facility] Facility[facility]Room:124B Level Of Care: SNF Provider: Merrilee SeashoreALEXANDER, Chinenye Katzenberger D Emergency Contacts: Extended Emergency Contact Information Primary Emergency Contact: Junie BameHowell,Tammy  [place] Home Phone: [phone] Relation: Daughter  Code Status: DNR  Allergies: Review of patient's allergies indicates no known allergies.  Chief Complaint  Patient presents with  . Discharge Note    HPI: Patient is 74 y.o. female with COPD,dementia 2/2 ETOH abuse, GERD,arthritis who was admitted to SNF after being hospitalized for progressing dementia with delusions and hallucinations. Pt was admitted to SNF for same reason and now pt is being d/c to home with daughter. Pt has been stable psychiatrically.  Past Medical History  Diagnosis Date  . COPD (chronic obstructive pulmonary disease) (HCC)   . 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 H/O ETOH abuse   . Oxygen dependent     2l per nasal cannula  . GERD (gastroesophageal reflux disease)   . Osteopenia   . Arthritis   . Dementia   . Cataracts, bilateral   . Hx of blood clots   . Amnesia     Past Surgical History  Procedure Laterality Date  . Hip surgery    . Fracture surgery    . Cataract extraction w/phaco Right 01/08/2013    Procedure: PHACOEMULSIFICATION, POSTERIOR CHAMBER INTRAOCULAR LENS PLACEMENT;  Surgeon: Shade FloodGreer Geiger, MD;  [facility];  Service: Ophthalmology;  Laterality: Right;  ATTEMPTED LOCAL ANESTHESIA; PATIENT RE-PREPPED AND DRAPED FOR GENERAL ANESTHESIA      Medication List       This list is accurate as of: 08/24/15  2:10 PM.  Always use your most recent med list.               albuterol (2.5 MG/3ML) 0.083% nebulizer solution  Commonly known as:  PROVENTIL  Take 2.5 mg by nebulization every 6 (six) hours as needed for shortness of breath.     PROAIR HFA 108 (90 BASE) MCG/ACT inhaler  Generic drug:  albuterol   Inhale 2 puffs into the lungs every 6 (six) hours as needed for wheezing or shortness of breath.     arformoterol 15 MCG/2ML Nebu  Commonly known as:  BROVANA  Take 15 mcg by nebulization 2 (two) times daily.     ATROVENT HFA 17 MCG/ACT inhaler  Generic drug:  ipratropium  Inhale 2 puffs into the lungs every 6 (six) hours as needed.     benzonatate 100 MG capsule  Commonly known as:  TESSALON  Take 200 mg by mouth 2 (two) times daily.     budesonide 0.5 MG/2ML nebulizer solution  Commonly known as:  PULMICORT  Take 0.5 mg by nebulization 2 (two) times daily.     cetirizine 10 MG tablet  Commonly known as:  ZYRTEC  Take 10 mg by mouth every morning.     famotidine 20 MG tablet  Commonly known as:  PEPCID  Take 20 mg by mouth 2 (two) times daily.     FERROUS SULFATE PO  Take by mouth daily.     folic acid 1 MG tablet  Commonly known as:  FOLVITE  Take 1 mg by mouth every morning.     lactose free nutrition Liqd  Take 240 mLs by mouth 2 (two) times daily between meals.     megestrol 400 MG/10ML suspension  Commonly known as:  MEGACE  Take 20 mLs (800 mg total) by mouth  daily.     meloxicam 7.5 MG tablet  Commonly known as:  MOBIC  Take 7.5 mg by mouth daily.     mirtazapine 15 MG tablet  Commonly known as:  REMERON  Take 15 mg by mouth at bedtime.     multivitamin tablet  Take 1 tablet by mouth daily.     NAMENDA XR 28 MG Cp24 24 hr capsule  Generic drug:  memantine  Take 28 mg by mouth daily.     SYSTANE OP  Place 1 drop into the right eye daily as needed (for dry eyes).     tiotropium 18 MCG inhalation capsule  Commonly known as:  SPIRIVA  Place 18 mcg into inhaler and inhale every morning.     VITAMIN D PO  Take by mouth daily.        No orders of the defined types were placed in this encounter.    Immunization History  Administered Date(s) Administered  . Influenza Split 07/24/2011, 07/02/2012, 07/10/2013  . Influenza-Unspecified 07/23/2014   . Pneumococcal Conjugate-13 10/01/2009    Social History  Substance Use Topics  . Smoking status: Former Smoker -- 0.50 packs/day for 50 years    Types: Cigarettes    Quit date: 02/21/2011  . Smokeless tobacco: Never Used  . Alcohol Use: No     Comment: H/O abuse.  None now.    There were no vitals filed for this visit.  Physical Exam  GENERAL APPEARANCE:Thin BF Alert, conversant. No acute distress.  HEENT: Unremarkable. RESPIRATORY: Breathing is even, unlabored. Lung sounds are dec but clear   CARDIOVASCULAR: Heart RRR no murmurs, rubs or gallops. No peripheral edema.  GASTROINTESTINAL: Abdomen is soft, non-tender, not distended w/ normal bowel sounds.  NEUROLOGIC: Cranial nerves 2-12 grossly intact. Moves all extremities  Patient Active Problem List   Diagnosis Date Noted  . Allergic rhinitis, seasonal 08/21/2015  . GERD without esophagitis 08/21/2015  . Osteoarthritis of multiple joints 08/21/2015  . Depression with anxiety 08/21/2015  . Abnormal chest x-ray 05/17/2015  . Severe protein-calorie malnutrition (HCC) 05/27/2013  . Chronic respiratory failure (HCC) 02/19/2013  . COPD (chronic obstructive pulmonary disease)Gold D   . H/O ETOH abuse   . 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)     CBC    Component Value Date/Time   WBC 4.7 07/07/2015 2305   RBC 4.54 07/07/2015 2305   RBC 4.73 02/19/2009 1035   HGB 11.0* 07/07/2015 2305   HCT 35.7* 07/07/2015 2305   PLT 179 07/07/2015 2305   MCV 78.6 07/07/2015 2305   LYMPHSABS 1.1 10/04/2011 0215   MONOABS 0.6 10/04/2011 0215   EOSABS 0.0 10/04/2011 0215   BASOSABS 0.0 10/04/2011 0215    CMP     Component Value Date/Time   NA 140 07/07/2015 2305   K 3.9 07/07/2015 2305   CL 102 07/07/2015 2305   CO2 32 07/07/2015 2305   GLUCOSE 101* 07/07/2015 2305   BUN 22* 07/07/2015 2305   CREATININE 0.70 07/07/2015 2305   CALCIUM 9.5 07/07/2015 2305   PROT 6.7 07/07/2015 2305   ALBUMIN 3.6 07/07/2015 2305   AST 23 07/07/2015  2305   ALT 13* 07/07/2015 2305   ALKPHOS 56 07/07/2015 2305   BILITOT 0.2* 07/07/2015 2305   GFRNONAA >60 07/07/2015 2305   GFRAA >60 07/07/2015 2305    Assessment and Plan  Pt is stable for d/c to home with HH/OT/PT. Pt is d/c on 3L [place] O2 with accompanying for. Rx's have been writte.  Time spent 35 min; > 50% of time with patient was spent reviewing records, labs, tests and studies, counseling and developing plan of care  Margit Hanks, MD

## 2015-08-30 DIAGNOSIS — R488 Other symbolic dysfunctions: Secondary | ICD-10-CM | POA: Diagnosis not present

## 2015-08-30 DIAGNOSIS — M6281 Muscle weakness (generalized): Secondary | ICD-10-CM | POA: Diagnosis not present

## 2015-08-30 DIAGNOSIS — F0280 Dementia in other diseases classified elsewhere without behavioral disturbance: Secondary | ICD-10-CM | POA: Diagnosis not present

## 2015-08-30 DIAGNOSIS — M8580 Other specified disorders of bone density and structure, unspecified site: Secondary | ICD-10-CM | POA: Diagnosis not present

## 2015-08-30 DIAGNOSIS — J9600 Acute respiratory failure, unspecified whether with hypoxia or hypercapnia: Secondary | ICD-10-CM | POA: Diagnosis not present

## 2015-08-30 DIAGNOSIS — J449 Chronic obstructive pulmonary disease, unspecified: Secondary | ICD-10-CM | POA: Diagnosis not present

## 2015-08-31 DIAGNOSIS — M8580 Other specified disorders of bone density and structure, unspecified site: Secondary | ICD-10-CM | POA: Diagnosis not present

## 2015-08-31 DIAGNOSIS — J449 Chronic obstructive pulmonary disease, unspecified: Secondary | ICD-10-CM | POA: Diagnosis not present

## 2015-08-31 DIAGNOSIS — F0280 Dementia in other diseases classified elsewhere without behavioral disturbance: Secondary | ICD-10-CM | POA: Diagnosis not present

## 2015-08-31 DIAGNOSIS — R488 Other symbolic dysfunctions: Secondary | ICD-10-CM | POA: Diagnosis not present

## 2015-08-31 DIAGNOSIS — M6281 Muscle weakness (generalized): Secondary | ICD-10-CM | POA: Diagnosis not present

## 2015-08-31 DIAGNOSIS — J9600 Acute respiratory failure, unspecified whether with hypoxia or hypercapnia: Secondary | ICD-10-CM | POA: Diagnosis not present

## 2015-09-01 DIAGNOSIS — F0390 Unspecified dementia without behavioral disturbance: Secondary | ICD-10-CM | POA: Diagnosis not present

## 2015-09-01 DIAGNOSIS — M8580 Other specified disorders of bone density and structure, unspecified site: Secondary | ICD-10-CM | POA: Diagnosis not present

## 2015-09-01 DIAGNOSIS — F0280 Dementia in other diseases classified elsewhere without behavioral disturbance: Secondary | ICD-10-CM | POA: Diagnosis not present

## 2015-09-01 DIAGNOSIS — J449 Chronic obstructive pulmonary disease, unspecified: Secondary | ICD-10-CM | POA: Diagnosis not present

## 2015-09-01 DIAGNOSIS — J9600 Acute respiratory failure, unspecified whether with hypoxia or hypercapnia: Secondary | ICD-10-CM | POA: Diagnosis not present

## 2015-09-01 DIAGNOSIS — F411 Generalized anxiety disorder: Secondary | ICD-10-CM | POA: Diagnosis not present

## 2015-09-01 DIAGNOSIS — R488 Other symbolic dysfunctions: Secondary | ICD-10-CM | POA: Diagnosis not present

## 2015-09-01 DIAGNOSIS — M6281 Muscle weakness (generalized): Secondary | ICD-10-CM | POA: Diagnosis not present

## 2015-09-02 DIAGNOSIS — F0280 Dementia in other diseases classified elsewhere without behavioral disturbance: Secondary | ICD-10-CM | POA: Diagnosis not present

## 2015-09-02 DIAGNOSIS — J9600 Acute respiratory failure, unspecified whether with hypoxia or hypercapnia: Secondary | ICD-10-CM | POA: Diagnosis not present

## 2015-09-02 DIAGNOSIS — J449 Chronic obstructive pulmonary disease, unspecified: Secondary | ICD-10-CM | POA: Diagnosis not present

## 2015-09-02 DIAGNOSIS — R488 Other symbolic dysfunctions: Secondary | ICD-10-CM | POA: Diagnosis not present

## 2015-09-02 DIAGNOSIS — M6281 Muscle weakness (generalized): Secondary | ICD-10-CM | POA: Diagnosis not present

## 2015-09-02 DIAGNOSIS — M8580 Other specified disorders of bone density and structure, unspecified site: Secondary | ICD-10-CM | POA: Diagnosis not present

## 2015-09-03 DIAGNOSIS — F0280 Dementia in other diseases classified elsewhere without behavioral disturbance: Secondary | ICD-10-CM | POA: Diagnosis not present

## 2015-09-03 DIAGNOSIS — J9600 Acute respiratory failure, unspecified whether with hypoxia or hypercapnia: Secondary | ICD-10-CM | POA: Diagnosis not present

## 2015-09-03 DIAGNOSIS — J449 Chronic obstructive pulmonary disease, unspecified: Secondary | ICD-10-CM | POA: Diagnosis not present

## 2015-09-03 DIAGNOSIS — M8580 Other specified disorders of bone density and structure, unspecified site: Secondary | ICD-10-CM | POA: Diagnosis not present

## 2015-09-03 DIAGNOSIS — M6281 Muscle weakness (generalized): Secondary | ICD-10-CM | POA: Diagnosis not present

## 2015-09-03 DIAGNOSIS — R488 Other symbolic dysfunctions: Secondary | ICD-10-CM | POA: Diagnosis not present

## 2015-09-04 DIAGNOSIS — R488 Other symbolic dysfunctions: Secondary | ICD-10-CM | POA: Diagnosis not present

## 2015-09-04 DIAGNOSIS — M6281 Muscle weakness (generalized): Secondary | ICD-10-CM | POA: Diagnosis not present

## 2015-09-04 DIAGNOSIS — J9600 Acute respiratory failure, unspecified whether with hypoxia or hypercapnia: Secondary | ICD-10-CM | POA: Diagnosis not present

## 2015-09-04 DIAGNOSIS — J449 Chronic obstructive pulmonary disease, unspecified: Secondary | ICD-10-CM | POA: Diagnosis not present

## 2015-09-04 DIAGNOSIS — F0280 Dementia in other diseases classified elsewhere without behavioral disturbance: Secondary | ICD-10-CM | POA: Diagnosis not present

## 2015-09-04 DIAGNOSIS — M8580 Other specified disorders of bone density and structure, unspecified site: Secondary | ICD-10-CM | POA: Diagnosis not present

## 2015-09-06 DIAGNOSIS — F0280 Dementia in other diseases classified elsewhere without behavioral disturbance: Secondary | ICD-10-CM | POA: Diagnosis not present

## 2015-09-06 DIAGNOSIS — J9600 Acute respiratory failure, unspecified whether with hypoxia or hypercapnia: Secondary | ICD-10-CM | POA: Diagnosis not present

## 2015-09-06 DIAGNOSIS — M8580 Other specified disorders of bone density and structure, unspecified site: Secondary | ICD-10-CM | POA: Diagnosis not present

## 2015-09-06 DIAGNOSIS — R488 Other symbolic dysfunctions: Secondary | ICD-10-CM | POA: Diagnosis not present

## 2015-09-06 DIAGNOSIS — M6281 Muscle weakness (generalized): Secondary | ICD-10-CM | POA: Diagnosis not present

## 2015-09-06 DIAGNOSIS — J449 Chronic obstructive pulmonary disease, unspecified: Secondary | ICD-10-CM | POA: Diagnosis not present

## 2015-09-07 DIAGNOSIS — M6281 Muscle weakness (generalized): Secondary | ICD-10-CM | POA: Diagnosis not present

## 2015-09-07 DIAGNOSIS — J9600 Acute respiratory failure, unspecified whether with hypoxia or hypercapnia: Secondary | ICD-10-CM | POA: Diagnosis not present

## 2015-09-07 DIAGNOSIS — F0280 Dementia in other diseases classified elsewhere without behavioral disturbance: Secondary | ICD-10-CM | POA: Diagnosis not present

## 2015-09-07 DIAGNOSIS — R488 Other symbolic dysfunctions: Secondary | ICD-10-CM | POA: Diagnosis not present

## 2015-09-07 DIAGNOSIS — M8580 Other specified disorders of bone density and structure, unspecified site: Secondary | ICD-10-CM | POA: Diagnosis not present

## 2015-09-07 DIAGNOSIS — J449 Chronic obstructive pulmonary disease, unspecified: Secondary | ICD-10-CM | POA: Diagnosis not present

## 2015-09-08 DIAGNOSIS — M6281 Muscle weakness (generalized): Secondary | ICD-10-CM | POA: Diagnosis not present

## 2015-09-08 DIAGNOSIS — J449 Chronic obstructive pulmonary disease, unspecified: Secondary | ICD-10-CM | POA: Diagnosis not present

## 2015-09-08 DIAGNOSIS — M8580 Other specified disorders of bone density and structure, unspecified site: Secondary | ICD-10-CM | POA: Diagnosis not present

## 2015-09-08 DIAGNOSIS — R488 Other symbolic dysfunctions: Secondary | ICD-10-CM | POA: Diagnosis not present

## 2015-09-08 DIAGNOSIS — F0280 Dementia in other diseases classified elsewhere without behavioral disturbance: Secondary | ICD-10-CM | POA: Diagnosis not present

## 2015-09-08 DIAGNOSIS — J9600 Acute respiratory failure, unspecified whether with hypoxia or hypercapnia: Secondary | ICD-10-CM | POA: Diagnosis not present

## 2015-09-09 DIAGNOSIS — J9600 Acute respiratory failure, unspecified whether with hypoxia or hypercapnia: Secondary | ICD-10-CM | POA: Diagnosis not present

## 2015-09-09 DIAGNOSIS — R488 Other symbolic dysfunctions: Secondary | ICD-10-CM | POA: Diagnosis not present

## 2015-09-09 DIAGNOSIS — F0280 Dementia in other diseases classified elsewhere without behavioral disturbance: Secondary | ICD-10-CM | POA: Diagnosis not present

## 2015-09-09 DIAGNOSIS — J449 Chronic obstructive pulmonary disease, unspecified: Secondary | ICD-10-CM | POA: Diagnosis not present

## 2015-09-09 DIAGNOSIS — M6281 Muscle weakness (generalized): Secondary | ICD-10-CM | POA: Diagnosis not present

## 2015-09-09 DIAGNOSIS — M8580 Other specified disorders of bone density and structure, unspecified site: Secondary | ICD-10-CM | POA: Diagnosis not present

## 2015-09-10 DIAGNOSIS — M8580 Other specified disorders of bone density and structure, unspecified site: Secondary | ICD-10-CM | POA: Diagnosis not present

## 2015-09-10 DIAGNOSIS — J9600 Acute respiratory failure, unspecified whether with hypoxia or hypercapnia: Secondary | ICD-10-CM | POA: Diagnosis not present

## 2015-09-10 DIAGNOSIS — F0280 Dementia in other diseases classified elsewhere without behavioral disturbance: Secondary | ICD-10-CM | POA: Diagnosis not present

## 2015-09-10 DIAGNOSIS — J449 Chronic obstructive pulmonary disease, unspecified: Secondary | ICD-10-CM | POA: Diagnosis not present

## 2015-09-10 DIAGNOSIS — M6281 Muscle weakness (generalized): Secondary | ICD-10-CM | POA: Diagnosis not present

## 2015-09-10 DIAGNOSIS — R488 Other symbolic dysfunctions: Secondary | ICD-10-CM | POA: Diagnosis not present

## 2015-09-12 DIAGNOSIS — R488 Other symbolic dysfunctions: Secondary | ICD-10-CM | POA: Diagnosis not present

## 2015-09-12 DIAGNOSIS — J9600 Acute respiratory failure, unspecified whether with hypoxia or hypercapnia: Secondary | ICD-10-CM | POA: Diagnosis not present

## 2015-09-12 DIAGNOSIS — J449 Chronic obstructive pulmonary disease, unspecified: Secondary | ICD-10-CM | POA: Diagnosis not present

## 2015-09-12 DIAGNOSIS — M6281 Muscle weakness (generalized): Secondary | ICD-10-CM | POA: Diagnosis not present

## 2015-09-12 DIAGNOSIS — M8580 Other specified disorders of bone density and structure, unspecified site: Secondary | ICD-10-CM | POA: Diagnosis not present

## 2015-09-12 DIAGNOSIS — F0280 Dementia in other diseases classified elsewhere without behavioral disturbance: Secondary | ICD-10-CM | POA: Diagnosis not present

## 2015-09-13 DIAGNOSIS — F0280 Dementia in other diseases classified elsewhere without behavioral disturbance: Secondary | ICD-10-CM | POA: Diagnosis not present

## 2015-09-13 DIAGNOSIS — J9600 Acute respiratory failure, unspecified whether with hypoxia or hypercapnia: Secondary | ICD-10-CM | POA: Diagnosis not present

## 2015-09-13 DIAGNOSIS — R488 Other symbolic dysfunctions: Secondary | ICD-10-CM | POA: Diagnosis not present

## 2015-09-13 DIAGNOSIS — M8580 Other specified disorders of bone density and structure, unspecified site: Secondary | ICD-10-CM | POA: Diagnosis not present

## 2015-09-13 DIAGNOSIS — M6281 Muscle weakness (generalized): Secondary | ICD-10-CM | POA: Diagnosis not present

## 2015-09-13 DIAGNOSIS — J449 Chronic obstructive pulmonary disease, unspecified: Secondary | ICD-10-CM | POA: Diagnosis not present

## 2015-09-14 DIAGNOSIS — J449 Chronic obstructive pulmonary disease, unspecified: Secondary | ICD-10-CM | POA: Diagnosis not present

## 2015-09-14 DIAGNOSIS — M6281 Muscle weakness (generalized): Secondary | ICD-10-CM | POA: Diagnosis not present

## 2015-09-14 DIAGNOSIS — R488 Other symbolic dysfunctions: Secondary | ICD-10-CM | POA: Diagnosis not present

## 2015-09-14 DIAGNOSIS — F0280 Dementia in other diseases classified elsewhere without behavioral disturbance: Secondary | ICD-10-CM | POA: Diagnosis not present

## 2015-09-14 DIAGNOSIS — J9600 Acute respiratory failure, unspecified whether with hypoxia or hypercapnia: Secondary | ICD-10-CM | POA: Diagnosis not present

## 2015-09-14 DIAGNOSIS — M8580 Other specified disorders of bone density and structure, unspecified site: Secondary | ICD-10-CM | POA: Diagnosis not present

## 2015-09-15 DIAGNOSIS — F0280 Dementia in other diseases classified elsewhere without behavioral disturbance: Secondary | ICD-10-CM | POA: Diagnosis not present

## 2015-09-15 DIAGNOSIS — M6281 Muscle weakness (generalized): Secondary | ICD-10-CM | POA: Diagnosis not present

## 2015-09-15 DIAGNOSIS — J9600 Acute respiratory failure, unspecified whether with hypoxia or hypercapnia: Secondary | ICD-10-CM | POA: Diagnosis not present

## 2015-09-15 DIAGNOSIS — M8580 Other specified disorders of bone density and structure, unspecified site: Secondary | ICD-10-CM | POA: Diagnosis not present

## 2015-09-15 DIAGNOSIS — J449 Chronic obstructive pulmonary disease, unspecified: Secondary | ICD-10-CM | POA: Diagnosis not present

## 2015-09-15 DIAGNOSIS — R488 Other symbolic dysfunctions: Secondary | ICD-10-CM | POA: Diagnosis not present

## 2015-09-16 DIAGNOSIS — R488 Other symbolic dysfunctions: Secondary | ICD-10-CM | POA: Diagnosis not present

## 2015-09-16 DIAGNOSIS — M8580 Other specified disorders of bone density and structure, unspecified site: Secondary | ICD-10-CM | POA: Diagnosis not present

## 2015-09-16 DIAGNOSIS — J9600 Acute respiratory failure, unspecified whether with hypoxia or hypercapnia: Secondary | ICD-10-CM | POA: Diagnosis not present

## 2015-09-16 DIAGNOSIS — F0280 Dementia in other diseases classified elsewhere without behavioral disturbance: Secondary | ICD-10-CM | POA: Diagnosis not present

## 2015-09-16 DIAGNOSIS — M6281 Muscle weakness (generalized): Secondary | ICD-10-CM | POA: Diagnosis not present

## 2015-09-16 DIAGNOSIS — J449 Chronic obstructive pulmonary disease, unspecified: Secondary | ICD-10-CM | POA: Diagnosis not present

## 2015-09-17 DIAGNOSIS — M8580 Other specified disorders of bone density and structure, unspecified site: Secondary | ICD-10-CM | POA: Diagnosis not present

## 2015-09-17 DIAGNOSIS — J9600 Acute respiratory failure, unspecified whether with hypoxia or hypercapnia: Secondary | ICD-10-CM | POA: Diagnosis not present

## 2015-09-17 DIAGNOSIS — J449 Chronic obstructive pulmonary disease, unspecified: Secondary | ICD-10-CM | POA: Diagnosis not present

## 2015-09-17 DIAGNOSIS — R488 Other symbolic dysfunctions: Secondary | ICD-10-CM | POA: Diagnosis not present

## 2015-09-17 DIAGNOSIS — F0280 Dementia in other diseases classified elsewhere without behavioral disturbance: Secondary | ICD-10-CM | POA: Diagnosis not present

## 2015-09-17 DIAGNOSIS — M6281 Muscle weakness (generalized): Secondary | ICD-10-CM | POA: Diagnosis not present

## 2015-09-20 ENCOUNTER — Ambulatory Visit (INDEPENDENT_AMBULATORY_CARE_PROVIDER_SITE_OTHER): Payer: Medicare Other | Admitting: Internal Medicine

## 2015-09-20 ENCOUNTER — Encounter: Payer: Self-pay | Admitting: Internal Medicine

## 2015-09-20 VITALS — BP 124/74 | HR 103 | Ht 63.0 in | Wt 96.4 lb

## 2015-09-20 DIAGNOSIS — J9612 Chronic respiratory failure with hypercapnia: Secondary | ICD-10-CM

## 2015-09-20 DIAGNOSIS — J449 Chronic obstructive pulmonary disease, unspecified: Secondary | ICD-10-CM | POA: Diagnosis not present

## 2015-09-20 DIAGNOSIS — F17210 Nicotine dependence, cigarettes, uncomplicated: Secondary | ICD-10-CM | POA: Diagnosis not present

## 2015-09-20 DIAGNOSIS — M6281 Muscle weakness (generalized): Secondary | ICD-10-CM | POA: Diagnosis not present

## 2015-09-20 DIAGNOSIS — F0280 Dementia in other diseases classified elsewhere without behavioral disturbance: Secondary | ICD-10-CM | POA: Diagnosis not present

## 2015-09-20 DIAGNOSIS — J432 Centrilobular emphysema: Secondary | ICD-10-CM

## 2015-09-20 DIAGNOSIS — J9611 Chronic respiratory failure with hypoxia: Secondary | ICD-10-CM | POA: Diagnosis not present

## 2015-09-20 DIAGNOSIS — M8580 Other specified disorders of bone density and structure, unspecified site: Secondary | ICD-10-CM | POA: Diagnosis not present

## 2015-09-20 DIAGNOSIS — Z720 Tobacco use: Secondary | ICD-10-CM

## 2015-09-20 DIAGNOSIS — R488 Other symbolic dysfunctions: Secondary | ICD-10-CM | POA: Diagnosis not present

## 2015-09-20 DIAGNOSIS — J9600 Acute respiratory failure, unspecified whether with hypoxia or hypercapnia: Secondary | ICD-10-CM | POA: Diagnosis not present

## 2015-09-20 NOTE — Patient Instructions (Signed)
No change in Dr Lynelle DoctorWright's recommendations   Wear 3 lpm whenever walking otherwise stay on 2lpm   Most important aspect of your care is to stop smoking now   Pulmonary follow up can be as needed

## 2015-09-20 NOTE — Assessment & Plan Note (Signed)

## 2015-09-20 NOTE — Progress Notes (Addendum)
Subjective:    Patient ID: [name], female    DOB: 01-22-1941, 74 y.o.   MRN: [id number]   Brief patient profile:  [id number] yobf active smoker former pt of Dr Delford FieldWright with GOLD IV COPD     History of Present Illness  05/17/2015  Dr Lynelle DoctorWright's final ov  Chief Complaint  Patient presents with  . Yearly Follow up    Last seen 08/2013.  Taking rest breaks more frequently d/t SOB x 2-3 months.  Minimal nonprod cough.  No wheezing, chest tightness, or CP.     rec No change in medications.  A chest xray will be obtained, we will call the results Increase oxygen 2Liter rest,   3  L exertion     09/20/2015  f/u ov/[name] re: COPD/ 02 dep / active smoker maint on brovana and pulmocort / spiriva Chief Complaint  Patient presents with  . Follow-up    Former patient of Dr Delford FieldWright. Pt states her breathing is much improved since her last visit here. She denies any new co's today.   doe = MMRC3 = can't walk 100 yards even at a slow pace at a flat grade s stopping due to sob  / even on 02  Very inactive, rarely feels need for saba   No obvious day to day or daytime variability or assoc chronic cough or cp or chest tightness, subjective wheeze or overt sinus or hb symptoms. No unusual exp hx or h/o childhood pna/ asthma or knowledge of premature birth.  Sleeping ok without nocturnal  or early am exacerbation  of respiratory  c/o's or need for noct saba. Also denies any obvious fluctuation of symptoms with weather or environmental changes or other aggravating or alleviating factors except as outlined above   Current Medications, Allergies, Complete Past Medical History, Past Surgical History, Family History, and Social History were reviewed in [facility] electronic medical record.  ROS  The following are not active complaints unless bolded sore throat, dysphagia, dental problems, itching, sneezing,  nasal congestion or excess/ purulent secretions, ear ache,   fever, chills, sweats, unintended  wt loss, classically pleuritic or exertional cp, hemoptysis,  orthopnea pnd or leg swelling, presyncope, palpitations, abdominal pain, anorexia, nausea, vomiting, diarrhea  or change in bowel or bladder habits, change in stools or urine, dysuria,hematuria,  rash, arthralgias, visual complaints, headache, numbness, weakness or ataxia or problems with walking or coordination,  change in mood/affect or memory.            Objective:   Physical Exam  amb thin bf nad   Wt Readings from Last 3 Encounters:  09/20/15 96 lb 6.4 oz (43.727 kg)  08/11/15 91 lb (41.277 kg)  07/12/15 93 lb (42.185 kg)    Vital signs reviewed  Gen: Pleasant, thin cachectic , in no distress,  normal affect  ENT: No lesions,  mouth clear,  oropharynx clear, no postnasal drip  Neck: No JVD, no TMG, no carotid bruits  Lungs: No use of accessory muscles, no dullness to percussion, very distant BS bilaterally   Cardiovascular: RRR, heart sounds normal, no murmur or gallops, no peripheral edema  Abdomen: soft and NT, no HSM,  BS normal  Musculoskeletal: No deformities, no cyanosis or clubbing  Neuro: alert, non focal  Skin: Warm, no lesions or rashes             Assessment & Plan:    Outpatient Encounter Prescriptions as of 09/20/2015  Medication Sig  . albuterol (PROAIR  HFA) 108 (90 BASE) MCG/ACT inhaler Inhale 2 puffs into the lungs every 6 (six) hours as needed for wheezing or shortness of breath.   Marland Kitchen albuterol (PROVENTIL) (2.5 MG/3ML) 0.083% nebulizer solution Take 2.5 mg by nebulization every 6 (six) hours as needed for shortness of breath.   . benzonatate (TESSALON) 100 MG capsule Take 200 mg by mouth 2 (two) times daily.   . budesonide (PULMICORT) 0.5 MG/2ML nebulizer solution Take 0.5 mg by nebulization 2 (two) times daily.   . cetirizine (ZYRTEC) 10 MG tablet Take 10 mg by mouth every morning.   . Cholecalciferol (VITAMIN D PO) Take by mouth daily.  . famotidine (PEPCID) 20 MG tablet Take 20 mg  by mouth 2 (two) times daily.   Marland Kitchen FERROUS SULFATE PO Take by mouth daily.  . folic acid (FOLVITE) 1 MG tablet Take 1 mg by mouth every morning.   Marland Kitchen ipratropium (ATROVENT HFA) 17 MCG/ACT inhaler Inhale 2 puffs into the lungs every 6 (six) hours as needed.   . megestrol (MEGACE) 400 MG/10ML suspension Take 20 mLs (800 mg total) by mouth daily.  . meloxicam (MOBIC) 7.5 MG tablet Take 7.5 mg by mouth daily.  . memantine (NAMENDA XR) 28 MG CP24 24 hr capsule Take 28 mg by mouth daily.  . mirtazapine (REMERON) 15 MG tablet Take 15 mg by mouth at bedtime.  . Multiple Vitamin (MULTIVITAMIN) tablet Take 1 tablet by mouth daily.  Bertram Gala Glycol-Propyl Glycol (SYSTANE OP) Place 1 drop into the right eye daily as needed (for dry eyes).   Marland Kitchen tiotropium (SPIRIVA) 18 MCG inhalation capsule Place 18 mcg into inhaler and inhale every morning.   Marland Kitchen arformoterol (BROVANA) 15 MCG/2ML NEBU Take 15 mcg by nebulization 2 (two) times daily.     No facility-administered encounter medications on file as of 09/20/2015.

## 2015-09-20 NOTE — Assessment & Plan Note (Addendum)
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  It's also why I strongly rec DNI status (ventilating pts down to a nl pC02 adversely affects this compensatory mechanism)

## 2015-09-21 DIAGNOSIS — F0280 Dementia in other diseases classified elsewhere without behavioral disturbance: Secondary | ICD-10-CM | POA: Diagnosis not present

## 2015-09-21 DIAGNOSIS — J9600 Acute respiratory failure, unspecified whether with hypoxia or hypercapnia: Secondary | ICD-10-CM | POA: Diagnosis not present

## 2015-09-21 DIAGNOSIS — M8580 Other specified disorders of bone density and structure, unspecified site: Secondary | ICD-10-CM | POA: Diagnosis not present

## 2015-09-21 DIAGNOSIS — J449 Chronic obstructive pulmonary disease, unspecified: Secondary | ICD-10-CM | POA: Diagnosis not present

## 2015-09-21 DIAGNOSIS — R488 Other symbolic dysfunctions: Secondary | ICD-10-CM | POA: Diagnosis not present

## 2015-09-21 DIAGNOSIS — M6281 Muscle weakness (generalized): Secondary | ICD-10-CM | POA: Diagnosis not present

## 2015-09-22 DIAGNOSIS — J449 Chronic obstructive pulmonary disease, unspecified: Secondary | ICD-10-CM | POA: Diagnosis not present

## 2015-09-22 DIAGNOSIS — F0280 Dementia in other diseases classified elsewhere without behavioral disturbance: Secondary | ICD-10-CM | POA: Diagnosis not present

## 2015-09-22 DIAGNOSIS — E46 Unspecified protein-calorie malnutrition: Secondary | ICD-10-CM | POA: Diagnosis not present

## 2015-09-22 DIAGNOSIS — R488 Other symbolic dysfunctions: Secondary | ICD-10-CM | POA: Diagnosis not present

## 2015-09-22 DIAGNOSIS — F329 Major depressive disorder, single episode, unspecified: Secondary | ICD-10-CM | POA: Diagnosis not present

## 2015-09-22 DIAGNOSIS — M8580 Other specified disorders of bone density and structure, unspecified site: Secondary | ICD-10-CM | POA: Diagnosis not present

## 2015-09-22 DIAGNOSIS — F411 Generalized anxiety disorder: Secondary | ICD-10-CM | POA: Diagnosis not present

## 2015-09-22 DIAGNOSIS — M6281 Muscle weakness (generalized): Secondary | ICD-10-CM | POA: Diagnosis not present

## 2015-09-22 DIAGNOSIS — J9600 Acute respiratory failure, unspecified whether with hypoxia or hypercapnia: Secondary | ICD-10-CM | POA: Diagnosis not present

## 2015-09-22 DIAGNOSIS — F0390 Unspecified dementia without behavioral disturbance: Secondary | ICD-10-CM | POA: Diagnosis not present

## 2015-09-23 DIAGNOSIS — J9600 Acute respiratory failure, unspecified whether with hypoxia or hypercapnia: Secondary | ICD-10-CM | POA: Diagnosis not present

## 2015-09-23 DIAGNOSIS — M8580 Other specified disorders of bone density and structure, unspecified site: Secondary | ICD-10-CM | POA: Diagnosis not present

## 2015-09-23 DIAGNOSIS — R488 Other symbolic dysfunctions: Secondary | ICD-10-CM | POA: Diagnosis not present

## 2015-09-23 DIAGNOSIS — F0280 Dementia in other diseases classified elsewhere without behavioral disturbance: Secondary | ICD-10-CM | POA: Diagnosis not present

## 2015-09-23 DIAGNOSIS — R4181 Age-related cognitive decline: Secondary | ICD-10-CM | POA: Diagnosis not present

## 2015-09-24 DIAGNOSIS — M8580 Other specified disorders of bone density and structure, unspecified site: Secondary | ICD-10-CM | POA: Diagnosis not present

## 2015-09-24 DIAGNOSIS — R4181 Age-related cognitive decline: Secondary | ICD-10-CM | POA: Diagnosis not present

## 2015-09-24 DIAGNOSIS — F0280 Dementia in other diseases classified elsewhere without behavioral disturbance: Secondary | ICD-10-CM | POA: Diagnosis not present

## 2015-09-24 DIAGNOSIS — R488 Other symbolic dysfunctions: Secondary | ICD-10-CM | POA: Diagnosis not present

## 2015-09-24 DIAGNOSIS — J9600 Acute respiratory failure, unspecified whether with hypoxia or hypercapnia: Secondary | ICD-10-CM | POA: Diagnosis not present

## 2015-09-27 DIAGNOSIS — F0280 Dementia in other diseases classified elsewhere without behavioral disturbance: Secondary | ICD-10-CM | POA: Diagnosis not present

## 2015-09-27 DIAGNOSIS — J9600 Acute respiratory failure, unspecified whether with hypoxia or hypercapnia: Secondary | ICD-10-CM | POA: Diagnosis not present

## 2015-09-27 DIAGNOSIS — M8580 Other specified disorders of bone density and structure, unspecified site: Secondary | ICD-10-CM | POA: Diagnosis not present

## 2015-09-27 DIAGNOSIS — R4181 Age-related cognitive decline: Secondary | ICD-10-CM | POA: Diagnosis not present

## 2015-09-27 DIAGNOSIS — R488 Other symbolic dysfunctions: Secondary | ICD-10-CM | POA: Diagnosis not present

## 2015-09-27 NOTE — Assessment & Plan Note (Addendum)
Gold   IV  COPD oxygen dependent FEV1 19% predicted 02/2012  Already on max rx but relatively stable course and the only change that needs to be made at this point is a serious commitment by pt to quit smoking     I had an extended discussion with the patient reviewing all relevant studies completed to date and  lasting 15 to 20 minutes of a 25 minute visit    Each maintenance medication was reviewed in detail including most importantly the difference between maintenance and prns and under what circumstances the prns are to be triggered using an action plan format that is not reflected in the computer generated alphabetically organized AVS.    Please see instructions for details which were reviewed in writing and the patient given a copy highlighting the part that I personally wrote and discussed at today's ov.

## 2015-09-28 DIAGNOSIS — M8580 Other specified disorders of bone density and structure, unspecified site: Secondary | ICD-10-CM | POA: Diagnosis not present

## 2015-09-28 DIAGNOSIS — R488 Other symbolic dysfunctions: Secondary | ICD-10-CM | POA: Diagnosis not present

## 2015-09-28 DIAGNOSIS — F0280 Dementia in other diseases classified elsewhere without behavioral disturbance: Secondary | ICD-10-CM | POA: Diagnosis not present

## 2015-09-28 DIAGNOSIS — R4181 Age-related cognitive decline: Secondary | ICD-10-CM | POA: Diagnosis not present

## 2015-09-28 DIAGNOSIS — J9600 Acute respiratory failure, unspecified whether with hypoxia or hypercapnia: Secondary | ICD-10-CM | POA: Diagnosis not present

## 2015-09-29 DIAGNOSIS — J9600 Acute respiratory failure, unspecified whether with hypoxia or hypercapnia: Secondary | ICD-10-CM | POA: Diagnosis not present

## 2015-09-29 DIAGNOSIS — R488 Other symbolic dysfunctions: Secondary | ICD-10-CM | POA: Diagnosis not present

## 2015-09-29 DIAGNOSIS — F0280 Dementia in other diseases classified elsewhere without behavioral disturbance: Secondary | ICD-10-CM | POA: Diagnosis not present

## 2015-09-29 DIAGNOSIS — M8580 Other specified disorders of bone density and structure, unspecified site: Secondary | ICD-10-CM | POA: Diagnosis not present

## 2015-09-29 DIAGNOSIS — R4181 Age-related cognitive decline: Secondary | ICD-10-CM | POA: Diagnosis not present

## 2015-09-30 DIAGNOSIS — R488 Other symbolic dysfunctions: Secondary | ICD-10-CM | POA: Diagnosis not present

## 2015-09-30 DIAGNOSIS — M8580 Other specified disorders of bone density and structure, unspecified site: Secondary | ICD-10-CM | POA: Diagnosis not present

## 2015-09-30 DIAGNOSIS — F0280 Dementia in other diseases classified elsewhere without behavioral disturbance: Secondary | ICD-10-CM | POA: Diagnosis not present

## 2015-09-30 DIAGNOSIS — J9600 Acute respiratory failure, unspecified whether with hypoxia or hypercapnia: Secondary | ICD-10-CM | POA: Diagnosis not present

## 2015-09-30 DIAGNOSIS — R4181 Age-related cognitive decline: Secondary | ICD-10-CM | POA: Diagnosis not present

## 2015-10-01 DIAGNOSIS — R4181 Age-related cognitive decline: Secondary | ICD-10-CM | POA: Diagnosis not present

## 2015-10-01 DIAGNOSIS — R488 Other symbolic dysfunctions: Secondary | ICD-10-CM | POA: Diagnosis not present

## 2015-10-01 DIAGNOSIS — J9600 Acute respiratory failure, unspecified whether with hypoxia or hypercapnia: Secondary | ICD-10-CM | POA: Diagnosis not present

## 2015-10-01 DIAGNOSIS — F0280 Dementia in other diseases classified elsewhere without behavioral disturbance: Secondary | ICD-10-CM | POA: Diagnosis not present

## 2015-10-01 DIAGNOSIS — M8580 Other specified disorders of bone density and structure, unspecified site: Secondary | ICD-10-CM | POA: Diagnosis not present

## 2015-10-02 DIAGNOSIS — M8580 Other specified disorders of bone density and structure, unspecified site: Secondary | ICD-10-CM | POA: Diagnosis not present

## 2015-10-02 DIAGNOSIS — R488 Other symbolic dysfunctions: Secondary | ICD-10-CM | POA: Diagnosis not present

## 2015-10-02 DIAGNOSIS — R4181 Age-related cognitive decline: Secondary | ICD-10-CM | POA: Diagnosis not present

## 2015-10-02 DIAGNOSIS — J9600 Acute respiratory failure, unspecified whether with hypoxia or hypercapnia: Secondary | ICD-10-CM | POA: Diagnosis not present

## 2015-10-02 DIAGNOSIS — F0280 Dementia in other diseases classified elsewhere without behavioral disturbance: Secondary | ICD-10-CM | POA: Diagnosis not present

## 2015-10-04 DIAGNOSIS — J9600 Acute respiratory failure, unspecified whether with hypoxia or hypercapnia: Secondary | ICD-10-CM | POA: Diagnosis not present

## 2015-10-04 DIAGNOSIS — R488 Other symbolic dysfunctions: Secondary | ICD-10-CM | POA: Diagnosis not present

## 2015-10-04 DIAGNOSIS — M8580 Other specified disorders of bone density and structure, unspecified site: Secondary | ICD-10-CM | POA: Diagnosis not present

## 2015-10-04 DIAGNOSIS — F0280 Dementia in other diseases classified elsewhere without behavioral disturbance: Secondary | ICD-10-CM | POA: Diagnosis not present

## 2015-10-04 DIAGNOSIS — R4181 Age-related cognitive decline: Secondary | ICD-10-CM | POA: Diagnosis not present

## 2015-10-05 DIAGNOSIS — F0280 Dementia in other diseases classified elsewhere without behavioral disturbance: Secondary | ICD-10-CM | POA: Diagnosis not present

## 2015-10-05 DIAGNOSIS — R488 Other symbolic dysfunctions: Secondary | ICD-10-CM | POA: Diagnosis not present

## 2015-10-05 DIAGNOSIS — J9600 Acute respiratory failure, unspecified whether with hypoxia or hypercapnia: Secondary | ICD-10-CM | POA: Diagnosis not present

## 2015-10-05 DIAGNOSIS — M8580 Other specified disorders of bone density and structure, unspecified site: Secondary | ICD-10-CM | POA: Diagnosis not present

## 2015-10-05 DIAGNOSIS — R4181 Age-related cognitive decline: Secondary | ICD-10-CM | POA: Diagnosis not present

## 2015-10-06 DIAGNOSIS — M8580 Other specified disorders of bone density and structure, unspecified site: Secondary | ICD-10-CM | POA: Diagnosis not present

## 2015-10-06 DIAGNOSIS — F0280 Dementia in other diseases classified elsewhere without behavioral disturbance: Secondary | ICD-10-CM | POA: Diagnosis not present

## 2015-10-06 DIAGNOSIS — J9600 Acute respiratory failure, unspecified whether with hypoxia or hypercapnia: Secondary | ICD-10-CM | POA: Diagnosis not present

## 2015-10-06 DIAGNOSIS — R4181 Age-related cognitive decline: Secondary | ICD-10-CM | POA: Diagnosis not present

## 2015-10-06 DIAGNOSIS — R488 Other symbolic dysfunctions: Secondary | ICD-10-CM | POA: Diagnosis not present

## 2015-10-07 DIAGNOSIS — F0280 Dementia in other diseases classified elsewhere without behavioral disturbance: Secondary | ICD-10-CM | POA: Diagnosis not present

## 2015-10-07 DIAGNOSIS — R4181 Age-related cognitive decline: Secondary | ICD-10-CM | POA: Diagnosis not present

## 2015-10-07 DIAGNOSIS — R488 Other symbolic dysfunctions: Secondary | ICD-10-CM | POA: Diagnosis not present

## 2015-10-07 DIAGNOSIS — M8580 Other specified disorders of bone density and structure, unspecified site: Secondary | ICD-10-CM | POA: Diagnosis not present

## 2015-10-07 DIAGNOSIS — J9600 Acute respiratory failure, unspecified whether with hypoxia or hypercapnia: Secondary | ICD-10-CM | POA: Diagnosis not present

## 2015-10-08 DIAGNOSIS — M8580 Other specified disorders of bone density and structure, unspecified site: Secondary | ICD-10-CM | POA: Diagnosis not present

## 2015-10-08 DIAGNOSIS — F0280 Dementia in other diseases classified elsewhere without behavioral disturbance: Secondary | ICD-10-CM | POA: Diagnosis not present

## 2015-10-08 DIAGNOSIS — R488 Other symbolic dysfunctions: Secondary | ICD-10-CM | POA: Diagnosis not present

## 2015-10-08 DIAGNOSIS — J9600 Acute respiratory failure, unspecified whether with hypoxia or hypercapnia: Secondary | ICD-10-CM | POA: Diagnosis not present

## 2015-10-08 DIAGNOSIS — R4181 Age-related cognitive decline: Secondary | ICD-10-CM | POA: Diagnosis not present

## 2015-10-11 DIAGNOSIS — R488 Other symbolic dysfunctions: Secondary | ICD-10-CM | POA: Diagnosis not present

## 2015-10-11 DIAGNOSIS — J9600 Acute respiratory failure, unspecified whether with hypoxia or hypercapnia: Secondary | ICD-10-CM | POA: Diagnosis not present

## 2015-10-11 DIAGNOSIS — R4181 Age-related cognitive decline: Secondary | ICD-10-CM | POA: Diagnosis not present

## 2015-10-11 DIAGNOSIS — F0280 Dementia in other diseases classified elsewhere without behavioral disturbance: Secondary | ICD-10-CM | POA: Diagnosis not present

## 2015-10-11 DIAGNOSIS — M8580 Other specified disorders of bone density and structure, unspecified site: Secondary | ICD-10-CM | POA: Diagnosis not present

## 2015-10-12 DIAGNOSIS — F0280 Dementia in other diseases classified elsewhere without behavioral disturbance: Secondary | ICD-10-CM | POA: Diagnosis not present

## 2015-10-12 DIAGNOSIS — M8580 Other specified disorders of bone density and structure, unspecified site: Secondary | ICD-10-CM | POA: Diagnosis not present

## 2015-10-12 DIAGNOSIS — R4181 Age-related cognitive decline: Secondary | ICD-10-CM | POA: Diagnosis not present

## 2015-10-12 DIAGNOSIS — R488 Other symbolic dysfunctions: Secondary | ICD-10-CM | POA: Diagnosis not present

## 2015-10-12 DIAGNOSIS — J9600 Acute respiratory failure, unspecified whether with hypoxia or hypercapnia: Secondary | ICD-10-CM | POA: Diagnosis not present

## 2015-10-13 DIAGNOSIS — F0280 Dementia in other diseases classified elsewhere without behavioral disturbance: Secondary | ICD-10-CM | POA: Diagnosis not present

## 2015-10-13 DIAGNOSIS — R4181 Age-related cognitive decline: Secondary | ICD-10-CM | POA: Diagnosis not present

## 2015-10-13 DIAGNOSIS — J9600 Acute respiratory failure, unspecified whether with hypoxia or hypercapnia: Secondary | ICD-10-CM | POA: Diagnosis not present

## 2015-10-13 DIAGNOSIS — R488 Other symbolic dysfunctions: Secondary | ICD-10-CM | POA: Diagnosis not present

## 2015-10-13 DIAGNOSIS — M8580 Other specified disorders of bone density and structure, unspecified site: Secondary | ICD-10-CM | POA: Diagnosis not present

## 2015-10-14 DIAGNOSIS — J9600 Acute respiratory failure, unspecified whether with hypoxia or hypercapnia: Secondary | ICD-10-CM | POA: Diagnosis not present

## 2015-10-14 DIAGNOSIS — R4181 Age-related cognitive decline: Secondary | ICD-10-CM | POA: Diagnosis not present

## 2015-10-14 DIAGNOSIS — R488 Other symbolic dysfunctions: Secondary | ICD-10-CM | POA: Diagnosis not present

## 2015-10-14 DIAGNOSIS — M8580 Other specified disorders of bone density and structure, unspecified site: Secondary | ICD-10-CM | POA: Diagnosis not present

## 2015-10-14 DIAGNOSIS — F0280 Dementia in other diseases classified elsewhere without behavioral disturbance: Secondary | ICD-10-CM | POA: Diagnosis not present

## 2015-10-15 DIAGNOSIS — M8580 Other specified disorders of bone density and structure, unspecified site: Secondary | ICD-10-CM | POA: Diagnosis not present

## 2015-10-15 DIAGNOSIS — F0280 Dementia in other diseases classified elsewhere without behavioral disturbance: Secondary | ICD-10-CM | POA: Diagnosis not present

## 2015-10-15 DIAGNOSIS — R4181 Age-related cognitive decline: Secondary | ICD-10-CM | POA: Diagnosis not present

## 2015-10-15 DIAGNOSIS — J9600 Acute respiratory failure, unspecified whether with hypoxia or hypercapnia: Secondary | ICD-10-CM | POA: Diagnosis not present

## 2015-10-15 DIAGNOSIS — R488 Other symbolic dysfunctions: Secondary | ICD-10-CM | POA: Diagnosis not present

## 2015-10-18 DIAGNOSIS — J9600 Acute respiratory failure, unspecified whether with hypoxia or hypercapnia: Secondary | ICD-10-CM | POA: Diagnosis not present

## 2015-10-18 DIAGNOSIS — R488 Other symbolic dysfunctions: Secondary | ICD-10-CM | POA: Diagnosis not present

## 2015-10-18 DIAGNOSIS — F0280 Dementia in other diseases classified elsewhere without behavioral disturbance: Secondary | ICD-10-CM | POA: Diagnosis not present

## 2015-10-18 DIAGNOSIS — R4181 Age-related cognitive decline: Secondary | ICD-10-CM | POA: Diagnosis not present

## 2015-10-18 DIAGNOSIS — M8580 Other specified disorders of bone density and structure, unspecified site: Secondary | ICD-10-CM | POA: Diagnosis not present

## 2015-10-19 DIAGNOSIS — R4181 Age-related cognitive decline: Secondary | ICD-10-CM | POA: Diagnosis not present

## 2015-10-19 DIAGNOSIS — R488 Other symbolic dysfunctions: Secondary | ICD-10-CM | POA: Diagnosis not present

## 2015-10-19 DIAGNOSIS — J9600 Acute respiratory failure, unspecified whether with hypoxia or hypercapnia: Secondary | ICD-10-CM | POA: Diagnosis not present

## 2015-10-19 DIAGNOSIS — F0280 Dementia in other diseases classified elsewhere without behavioral disturbance: Secondary | ICD-10-CM | POA: Diagnosis not present

## 2015-10-19 DIAGNOSIS — M8580 Other specified disorders of bone density and structure, unspecified site: Secondary | ICD-10-CM | POA: Diagnosis not present

## 2015-10-20 DIAGNOSIS — J9600 Acute respiratory failure, unspecified whether with hypoxia or hypercapnia: Secondary | ICD-10-CM | POA: Diagnosis not present

## 2015-10-20 DIAGNOSIS — M8580 Other specified disorders of bone density and structure, unspecified site: Secondary | ICD-10-CM | POA: Diagnosis not present

## 2015-10-20 DIAGNOSIS — R488 Other symbolic dysfunctions: Secondary | ICD-10-CM | POA: Diagnosis not present

## 2015-10-20 DIAGNOSIS — F0280 Dementia in other diseases classified elsewhere without behavioral disturbance: Secondary | ICD-10-CM | POA: Diagnosis not present

## 2015-10-20 DIAGNOSIS — R4181 Age-related cognitive decline: Secondary | ICD-10-CM | POA: Diagnosis not present

## 2015-10-21 DIAGNOSIS — R488 Other symbolic dysfunctions: Secondary | ICD-10-CM | POA: Diagnosis not present

## 2015-10-21 DIAGNOSIS — F0280 Dementia in other diseases classified elsewhere without behavioral disturbance: Secondary | ICD-10-CM | POA: Diagnosis not present

## 2015-10-21 DIAGNOSIS — R4181 Age-related cognitive decline: Secondary | ICD-10-CM | POA: Diagnosis not present

## 2015-10-21 DIAGNOSIS — J9600 Acute respiratory failure, unspecified whether with hypoxia or hypercapnia: Secondary | ICD-10-CM | POA: Diagnosis not present

## 2015-10-21 DIAGNOSIS — M8580 Other specified disorders of bone density and structure, unspecified site: Secondary | ICD-10-CM | POA: Diagnosis not present

## 2015-10-25 DIAGNOSIS — M8580 Other specified disorders of bone density and structure, unspecified site: Secondary | ICD-10-CM | POA: Diagnosis not present

## 2015-10-25 DIAGNOSIS — J9600 Acute respiratory failure, unspecified whether with hypoxia or hypercapnia: Secondary | ICD-10-CM | POA: Diagnosis not present

## 2015-10-25 DIAGNOSIS — J449 Chronic obstructive pulmonary disease, unspecified: Secondary | ICD-10-CM | POA: Diagnosis not present

## 2015-10-25 DIAGNOSIS — M6281 Muscle weakness (generalized): Secondary | ICD-10-CM | POA: Diagnosis not present

## 2015-10-26 ENCOUNTER — Ambulatory Visit: Payer: Medicare Other | Admitting: Adult Health

## 2015-10-26 DIAGNOSIS — M8580 Other specified disorders of bone density and structure, unspecified site: Secondary | ICD-10-CM | POA: Diagnosis not present

## 2015-10-26 DIAGNOSIS — M6281 Muscle weakness (generalized): Secondary | ICD-10-CM | POA: Diagnosis not present

## 2015-10-26 DIAGNOSIS — J449 Chronic obstructive pulmonary disease, unspecified: Secondary | ICD-10-CM | POA: Diagnosis not present

## 2015-10-26 DIAGNOSIS — J9600 Acute respiratory failure, unspecified whether with hypoxia or hypercapnia: Secondary | ICD-10-CM | POA: Diagnosis not present

## 2015-10-27 DIAGNOSIS — M6281 Muscle weakness (generalized): Secondary | ICD-10-CM | POA: Diagnosis not present

## 2015-10-27 DIAGNOSIS — J9600 Acute respiratory failure, unspecified whether with hypoxia or hypercapnia: Secondary | ICD-10-CM | POA: Diagnosis not present

## 2015-10-27 DIAGNOSIS — J449 Chronic obstructive pulmonary disease, unspecified: Secondary | ICD-10-CM | POA: Diagnosis not present

## 2015-10-27 DIAGNOSIS — M8580 Other specified disorders of bone density and structure, unspecified site: Secondary | ICD-10-CM | POA: Diagnosis not present

## 2015-10-27 DIAGNOSIS — T189XXA Foreign body of alimentary tract, part unspecified, initial encounter: Secondary | ICD-10-CM | POA: Diagnosis not present

## 2015-10-28 DIAGNOSIS — M6281 Muscle weakness (generalized): Secondary | ICD-10-CM | POA: Diagnosis not present

## 2015-10-28 DIAGNOSIS — M8580 Other specified disorders of bone density and structure, unspecified site: Secondary | ICD-10-CM | POA: Diagnosis not present

## 2015-10-28 DIAGNOSIS — J449 Chronic obstructive pulmonary disease, unspecified: Secondary | ICD-10-CM | POA: Diagnosis not present

## 2015-10-28 DIAGNOSIS — J9600 Acute respiratory failure, unspecified whether with hypoxia or hypercapnia: Secondary | ICD-10-CM | POA: Diagnosis not present

## 2015-11-04 DIAGNOSIS — F0391 Unspecified dementia with behavioral disturbance: Secondary | ICD-10-CM | POA: Diagnosis not present

## 2015-11-04 DIAGNOSIS — Z79899 Other long term (current) drug therapy: Secondary | ICD-10-CM | POA: Diagnosis not present

## 2015-11-18 DIAGNOSIS — S329XXA Fracture of unspecified parts of lumbosacral spine and pelvis, initial encounter for closed fracture: Secondary | ICD-10-CM | POA: Diagnosis not present

## 2015-11-19 DIAGNOSIS — J449 Chronic obstructive pulmonary disease, unspecified: Secondary | ICD-10-CM | POA: Diagnosis not present

## 2015-11-19 DIAGNOSIS — M6281 Muscle weakness (generalized): Secondary | ICD-10-CM | POA: Diagnosis not present

## 2015-11-19 DIAGNOSIS — J9600 Acute respiratory failure, unspecified whether with hypoxia or hypercapnia: Secondary | ICD-10-CM | POA: Diagnosis not present

## 2015-11-19 DIAGNOSIS — M8580 Other specified disorders of bone density and structure, unspecified site: Secondary | ICD-10-CM | POA: Diagnosis not present

## 2015-11-22 DIAGNOSIS — M6281 Muscle weakness (generalized): Secondary | ICD-10-CM | POA: Diagnosis not present

## 2015-11-22 DIAGNOSIS — Z9181 History of falling: Secondary | ICD-10-CM | POA: Diagnosis not present

## 2015-12-15 DIAGNOSIS — F0390 Unspecified dementia without behavioral disturbance: Secondary | ICD-10-CM | POA: Diagnosis not present

## 2015-12-15 DIAGNOSIS — E46 Unspecified protein-calorie malnutrition: Secondary | ICD-10-CM | POA: Diagnosis not present

## 2015-12-15 DIAGNOSIS — F411 Generalized anxiety disorder: Secondary | ICD-10-CM | POA: Diagnosis not present

## 2015-12-15 DIAGNOSIS — J449 Chronic obstructive pulmonary disease, unspecified: Secondary | ICD-10-CM | POA: Diagnosis not present

## 2015-12-20 NOTE — Progress Notes (Signed)
Patient ID: DELCENIA INMAN, female   DOB: [birth year], 75 y.o.   MRN: [id number]    HISTORY AND PHYSICAL   DATE: 07/12/15  Location:  [facility]    Place of Service: SNF (31)   Extended Emergency Contact Information Primary Emergency Contact: Meda Kline[place]ter [place] Phone: [phone] Relation: Daughter  Advanced Directive information  DNR; MOST FORM - NO FT  Chief Complaint  Patient presents with  . New Admit To SNF    HPI:  75 yo female seen today as a new admission into SNF from ED for placement. She lives with daughter but unable to care for her at this time.   She needs to taper ativan. Breathing improved. No CP. Still has SOB. She reports her nose feels dry. she is a poor historian due to dementia. Hx obtained from chart   COPD/O2 dependent - no exacerbations. Takes albuterol nebs, brovana, ipratropium HFA, proair HFA, spriva, pulmicort and tessalon perles. She also takes zyrtec for seasonal allergy  Dementia/Etoh abuse - on ativan taper per ED. Takes folate supplement. Takes namenda xr  Severe protein calorie malnutrition - she gets nutritional supplements, MVI and vit D. Takes megace and remeron for appetite stimulation  Arthritis/chronic pain - stable on mobic  GERD - stable on pepcid  Past hx LLE DVT - asymptomatic  Past Medical History  Diagnosis Date  . COPD (chronic obstructive pulmonary disease) ([place])   . 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 H/O ETOH abuse   . Oxygen dependent     2l per nasal cannula  . GERD (gastroesophageal reflux disease)   . Osteopenia   . Arthritis   . Dementia   . Cataracts, bilateral   . Hx of blood clots   . Amnesia     Past Surgical History  Procedure Laterality Date  . Hip surgery    . Fracture surgery    . Cataract extraction w/phaco Right 01/08/2013    Procedure: PHACOEMULSIFICATION, POSTERIOR CHAMBER INTRAOCULAR LENS PLACEMENT;  Surgeon: Adonis Brook, MD;  Location: [facility];   Service: Ophthalmology;  Laterality: Right;  ATTEMPTED LOCAL ANESTHESIA; PATIENT RE-PREPPED AND DRAPED FOR GENERAL ANESTHESIA    Patient Care Team: Nolene Ebbs, MD as PCP - General (Internal Medicine) Elsie Stain, MD as Attending Physician (Pulmonary Disease)  Social History   Social History  . Marital Status: Widowed    Spouse Name: N/A  . Number of Children: 6  . Years of Education: 10   Occupational History  . [employer], retired.    Social History Main Topics  . Smoking status: Current Some Day Smoker -- 0.50 packs/day for 50 years    Types: Cigarettes  . Smokeless tobacco: Never Used  . Alcohol Use: No     Comment: H/O abuse.  None now.  . Drug Use: No  . Sexual Activity: Not on file   Other Topics Concern  . Not on file   Social History Narrative   Widowed.  Lives with daughter Lynelle Smoke.  Ambulates without assistance at [place].   Patient does not drink caffeine.   Patient is right handed.     reports that she has been smoking Cigarettes.  She has a 25 pack-year smoking history. She has never used smokeless tobacco. She reports that she does not drink alcohol or use illicit drugs.  Family History  Problem Relation Age of Onset  . Emphysema Sister     smoker  . COPD Sister   .  Alzheimer's disease Sister   . Asthma Daughter   . Heart disease Father   . Deep vein thrombosis Daughter   . COPD Mother   . Alzheimer's disease Mother   . Heart disease Brother   . Alzheimer's disease Sister   . COPD Sister    Family Status  Relation Status Death Age  . Sister Deceased   . Father Deceased   . Mother Deceased   . Brother Deceased   . Sister Deceased   . Brother Deceased     Murdered    Immunization History  Administered Date(s) Administered  . Influenza Split 07/24/2011, 07/02/2012, 07/10/2013  . Influenza-Unspecified 07/23/2014, 08/17/2015  . PPD Test 07/23/2015  . Pneumococcal Conjugate-13 10/01/2009    No Known Allergies  Medications: Patient's  Medications  New Prescriptions   No medications on file  Previous Medications   ALBUTEROL (PROAIR HFA) 108 (90 BASE) MCG/ACT INHALER    Inhale 2 puffs into the lungs every 6 (six) hours as needed for wheezing or shortness of breath.    ALBUTEROL (PROVENTIL) (2.5 MG/3ML) 0.083% NEBULIZER SOLUTION    Take 2.5 mg by nebulization every 6 (six) hours as needed for shortness of breath.    ARFORMOTEROL (BROVANA) 15 MCG/2ML NEBU    Take 15 mcg by nebulization 2 (two) times daily.     BENZONATATE (TESSALON) 100 MG CAPSULE    Take 200 mg by mouth 2 (two) times daily.    BUDESONIDE (PULMICORT) 0.5 MG/2ML NEBULIZER SOLUTION    Take 0.5 mg by nebulization 2 (two) times daily.    CETIRIZINE (ZYRTEC) 10 MG TABLET    Take 10 mg by mouth every morning.    CHOLECALCIFEROL (VITAMIN D PO)    Take by mouth daily.   FAMOTIDINE (PEPCID) 20 MG TABLET    Take 20 mg by mouth 2 (two) times daily.    FERROUS SULFATE PO    Take by mouth daily.   FOLIC ACID (FOLVITE) 1 MG TABLET    Take 1 mg by mouth every morning.    IPRATROPIUM (ATROVENT HFA) 17 MCG/ACT INHALER    Inhale 2 puffs into the lungs every 6 (six) hours as needed.    MEGESTROL (MEGACE) 400 MG/10ML SUSPENSION    Take 20 mLs (800 mg total) by mouth daily.   MELOXICAM (MOBIC) 7.5 MG TABLET    Take 7.5 mg by mouth daily.   MEMANTINE (NAMENDA XR) 28 MG CP24 24 HR CAPSULE    Take 28 mg by mouth daily.   MIRTAZAPINE (REMERON) 15 MG TABLET    Take 15 mg by mouth at bedtime.   MULTIPLE VITAMIN (MULTIVITAMIN) TABLET    Take 1 tablet by mouth daily.   POLYETHYL GLYCOL-PROPYL GLYCOL (SYSTANE OP)    Place 1 drop into the right eye daily as needed (for dry eyes).    TIOTROPIUM (SPIRIVA) 18 MCG INHALATION CAPSULE    Place 18 mcg into inhaler and inhale every morning.   Modified Medications   No medications on file  Discontinued Medications   FEEDING SUPPLEMENT (ENSURE COMPLETE) LIQD    Take 237 mLs by mouth 2 (two) times daily between meals.   LACTOSE FREE NUTRITION  (BOOST) LIQD    Take 240 mLs by mouth 2 (two) times daily between meals.    LORAZEPAM (ATIVAN) 0.5 MG TABLET    Ativan 0.101m (1/2 tablet) twice daily for 5 days, decrease to 0.25 mg (1/2 tablet) daily for 5 days, decrease to 0.25 mg (1/2 tablet) every other  day for 5 days, then decrease to 0.25 mg (1/2 tablet) every 3 days for 7 days, then decrease to 0.52m (1/2 tablet) every 4 days for 7 days then discontinue    Review of Systems  Unable to perform ROS: Dementia    Filed Vitals:   07/12/15 1017  BP: 108/58  Pulse: 122  Temp: 98 F (36.7 C)  Weight: 93 lb (42.185 kg)  SpO2: 97%   Body mass index is 16.48 kg/(m^2).  Physical Exam  Constitutional: She appears well-developed.  Frail appearing sitting on bed. [place] O2 intact  HENT:  Mouth/Throat: Oropharynx is clear and moist. No oropharyngeal exudate.  Eyes: Pupils are equal, round, and reactive to light. No scleral icterus.  Neck: Neck supple. Carotid bruit is not present. No tracheal deviation present.  Cardiovascular: Normal rate, regular rhythm, normal heart sounds and intact distal pulses.   Occasional extrasystoles are present. Exam reveals no gallop and no friction rub.   No murmur heard. No LE edema b/l. no calf TTP.   Pulmonary/Chest: Effort normal and breath sounds normal. No stridor. No respiratory distress. She has no wheezes. She has no rales.  Abdominal: Soft. Bowel sounds are normal. She exhibits no distension and no mass. There is no hepatomegaly. There is no tenderness. There is no rebound and no guarding.  Musculoskeletal: She exhibits edema.  Lymphadenopathy:    She has no cervical adenopathy.  Neurological: She is alert.  Skin: Skin is warm and dry. No rash noted.  Psychiatric: She has a normal mood and affect. Her behavior is normal. Judgment normal.     Labs reviewed: Admission on 07/08/2015, Discharged on 07/09/2015  Component Date Value Ref Range Status  . Sodium 07/07/2015 140  135 - 145 mmol/L Final    . Potassium 07/07/2015 3.9  3.5 - 5.1 mmol/L Final  . Chloride 07/07/2015 102  101 - 111 mmol/L Final  . CO2 07/07/2015 32  22 - 32 mmol/L Final  . Glucose, Bld 07/07/2015 101* 65 - 99 mg/dL Final  . BUN 07/07/2015 22* 6 - 20 mg/dL Final  . Creatinine, Ser 07/07/2015 0.70  0.44 - 1.00 mg/dL Final  . Calcium 07/07/2015 9.5  8.9 - 10.3 mg/dL Final  . Total Protein 07/07/2015 6.7  6.5 - 8.1 g/dL Final  . Albumin 07/07/2015 3.6  3.5 - 5.0 g/dL Final  . AST 07/07/2015 23  15 - 41 U/L Final  . ALT 07/07/2015 13* 14 - 54 U/L Final  . Alkaline Phosphatase 07/07/2015 56  38 - 126 U/L Final  . Total Bilirubin 07/07/2015 0.2* 0.3 - 1.2 mg/dL Final  . GFR calc non Af Amer 07/07/2015 >60  >60 mL/min Final  . GFR calc Af Amer 07/07/2015 >60  >60 mL/min Final   Comment: (NOTE) The eGFR has been calculated using the CKD EPI equation. This calculation has not been validated in all clinical situations. eGFR's persistently <60 mL/min signify possible Chronic Kidney Disease.   . Anion gap 07/07/2015 6  5 - 15 Final  . WBC 07/07/2015 4.7  4.0 - 10.5 K/uL Final  . RBC 07/07/2015 4.54  3.87 - 5.11 MIL/uL Final  . Hemoglobin 07/07/2015 11.0* 12.0 - 15.0 g/dL Final  . HCT 07/07/2015 35.7* 36.0 - 46.0 % Final  . MCV 07/07/2015 78.6  78.0 - 100.0 fL Final  . MCH 07/07/2015 24.2* 26.0 - 34.0 pg Final  . MCHC 07/07/2015 30.8  30.0 - 36.0 g/dL Final  . RDW 07/07/2015 14.8  11.5 - 15.5 %  Final  . Platelets 07/07/2015 179  150 - 400 K/uL Final  . Glucose-Capillary 07/08/2015 71  65 - 99 mg/dL Final  . Color, Urine 07/08/2015 YELLOW  YELLOW Final  . APPearance 07/08/2015 CLEAR  CLEAR Final  . Specific Gravity, Urine 07/08/2015 1.009  1.005 - 1.030 Final  . pH 07/08/2015 7.5  5.0 - 8.0 Final  . Glucose, UA 07/08/2015 NEGATIVE  NEGATIVE mg/dL Final  . Hgb urine dipstick 07/08/2015 NEGATIVE  NEGATIVE Final  . Bilirubin Urine 07/08/2015 NEGATIVE  NEGATIVE Final  . Ketones, ur 07/08/2015 NEGATIVE  NEGATIVE mg/dL  Final  . Protein, ur 07/08/2015 NEGATIVE  NEGATIVE mg/dL Final  . Urobilinogen, UA 07/08/2015 0.2  0.0 - 1.0 mg/dL Final  . Nitrite 07/08/2015 NEGATIVE  NEGATIVE Final  . Leukocytes, UA 07/08/2015 NEGATIVE  NEGATIVE Final   MICROSCOPIC NOT DONE ON URINES WITH NEGATIVE PROTEIN, BLOOD, LEUKOCYTES, NITRITE, OR GLUCOSE <1000 mg/dL.  [place] Kitchen Specimen Description 07/08/2015 URINE, CATHETERIZED   Final  . Special Requests 07/08/2015 NONE   Final  . Culture 07/08/2015 NO GROWTH 1 DAY   Final  . Report Status 07/08/2015 07/09/2015 FINAL   Final  . TSH 07/08/2015 2.453  0.350 - 4.500 uIU/mL Final  Admission on 06/21/2015, Discharged on 06/22/2015  Component Date Value Ref Range Status  . Sodium 06/21/2015 140  135 - 145 mmol/L Final  . Potassium 06/21/2015 4.4  3.5 - 5.1 mmol/L Final  . Chloride 06/21/2015 104  101 - 111 mmol/L Final  . CO2 06/21/2015 31  22 - 32 mmol/L Final  . Glucose, Bld 06/21/2015 131* 65 - 99 mg/dL Final  . BUN 06/21/2015 27* 6 - 20 mg/dL Final  . Creatinine, Ser 06/21/2015 0.82  0.44 - 1.00 mg/dL Final  . Calcium 06/21/2015 9.8  8.9 - 10.3 mg/dL Final  . GFR calc non Af Amer 06/21/2015 >60  >60 mL/min Final  . GFR calc Af Amer 06/21/2015 >60  >60 mL/min Final   Comment: (NOTE) The eGFR has been calculated using the CKD EPI equation. This calculation has not been validated in all clinical situations. eGFR's persistently <60 mL/min signify possible Chronic Kidney Disease.   . Anion gap 06/21/2015 5  5 - 15 Final  . WBC 06/21/2015 5.7  4.0 - 10.5 K/uL Final  . RBC 06/21/2015 4.69  3.87 - 5.11 MIL/uL Final  . Hemoglobin 06/21/2015 11.5* 12.0 - 15.0 g/dL Final  . HCT 06/21/2015 37.1  36.0 - 46.0 % Final  . MCV 06/21/2015 79.1  78.0 - 100.0 fL Final  . MCH 06/21/2015 24.5* 26.0 - 34.0 pg Final  . MCHC 06/21/2015 31.0  30.0 - 36.0 g/dL Final  . RDW 06/21/2015 15.4  11.5 - 15.5 % Final  . Platelets 06/21/2015 211  150 - 400 K/uL Final  . Color, Urine 06/22/2015 YELLOW  YELLOW  Final  . APPearance 06/22/2015 CLEAR  CLEAR Final  . Specific Gravity, Urine 06/22/2015 1.019  1.005 - 1.030 Final  . pH 06/22/2015 6.5  5.0 - 8.0 Final  . Glucose, UA 06/22/2015 NEGATIVE  NEGATIVE mg/dL Final  . Hgb urine dipstick 06/22/2015 NEGATIVE  NEGATIVE Final  . Bilirubin Urine 06/22/2015 NEGATIVE  NEGATIVE Final  . Ketones, ur 06/22/2015 NEGATIVE  NEGATIVE mg/dL Final  . Protein, ur 06/22/2015 NEGATIVE  NEGATIVE mg/dL Final  . Urobilinogen, UA 06/22/2015 0.2  0.0 - 1.0 mg/dL Final  . Nitrite 06/22/2015 NEGATIVE  NEGATIVE Final  . Leukocytes, UA 06/22/2015 LARGE* NEGATIVE Final  . Troponin i, poc  06/21/2015 0.01  0.00 - 0.08 ng/mL Final  . Comment 3 06/21/2015          Final   Comment: Due to the release kinetics of cTnI, a negative result within the first hours of the onset of symptoms does not rule out myocardial infarction with certainty. If myocardial infarction is still suspected, repeat the test at appropriate intervals.   . Squamous Epithelial / LPF 06/22/2015 RARE  RARE Final  . WBC, UA 06/22/2015 7-10  <3 WBC/hpf Final  . Bacteria, UA 06/22/2015 RARE  RARE Final  Appointment on 05/19/2015  Component Date Value Ref Range Status  . Sodium 05/19/2015 145  135 - 145 mEq/L Final  . Potassium 05/19/2015 3.7  3.5 - 5.1 mEq/L Final  . Chloride 05/19/2015 107  96 - 112 mEq/L Final  . CO2 05/19/2015 35* 19 - 32 mEq/L Final  . Glucose, Bld 05/19/2015 91  70 - 99 mg/dL Final  . BUN 05/19/2015 23  6 - 23 mg/dL Final  . Creatinine, Ser 05/19/2015 0.70  0.40 - 1.20 mg/dL Final  . Calcium 05/19/2015 9.5  8.4 - 10.5 mg/dL Final  . GFR 05/19/2015 105.38  >60.00 mL/min Final  Office Visit on 04/22/2015  Component Date Value Ref Range Status  . RPR Ser Ql 04/22/2015 Non Reactive  Non Reactive Final  . Vitamin B-12 04/22/2015 445  211 - 946 pg/mL Final  . HIV Screen 4th Generation wRfx 04/22/2015 Non Reactive  Non Reactive Final  . Thiamine 04/22/2015 120.1  66.5 - 200.0 nmol/L  Final  . Methylmalonic Acid 04/22/2015 189  0 - 378 nmol/L Final  . Copper 04/22/2015 95  72 - 166 ug/dL Final                                   Detection Limit = 5    No results found.   Assessment/Plan   ICD-9-CM ICD-10-CM   1. 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 Chronic respiratory failure with hypoxia and hypercapnia (HCC) 518.83 J96.11    799.02 J96.12    786.09    3. Chronic obstructive pulmonary disease, unspecified COPD type ([place]) 496 J44.9   4. H/O ETOH abuse 305.03 F10.10   5. Severe protein-calorie malnutrition ([place]) 262 E43   6. GERD without esophagitis 530.81 K21.9   7. Allergic rhinitis, seasonal 477.9 J30.2   8. Primary osteoarthritis involving multiple joints 715.09 M15.0     Ativan taper - Rx written  Cont other meds as ordered  [place] O2 ATC @ 3L/min  PT/OT/ST as ordered  Nutritional supplements as ordered  GOAL: short term rehab then probable long term care. Communicated with pt and nursing.  Will follow  Treazure Nery S. Perlie Gold  [facility] [address] [place], [place] [address] [phone] Cell (Monday-Friday 8 AM - 5 PM) [phone] After 5 PM and follow prompts

## 2016-01-18 DIAGNOSIS — Z79899 Other long term (current) drug therapy: Secondary | ICD-10-CM | POA: Diagnosis not present

## 2016-01-18 DIAGNOSIS — I10 Essential (primary) hypertension: Secondary | ICD-10-CM | POA: Diagnosis not present

## 2016-01-19 DIAGNOSIS — R05 Cough: Secondary | ICD-10-CM | POA: Diagnosis not present

## 2016-01-19 DIAGNOSIS — N390 Urinary tract infection, site not specified: Secondary | ICD-10-CM | POA: Diagnosis not present

## 2016-01-19 IMAGING — CT CT CHEST W/ CM
2 of 3 series · 15 of 36 positions shown, 18 images · IV contrast (omnipaque)
Comparison: Chest x-ray from 05/17/2015.  Chest CT from 02/17/2009.

CLINICAL DATA: Initial encounter for left lower lobe nodular
density seen on recent chest x-ray.

EXAM:
CT CHEST WITH CONTRAST
TECHNIQUE: Multidetector CT imaging of the chest was performed during
intravenous contrast administration.
CONTRAST:  80 mL Omnipaque 300

[Series 3: chest routine with · axial · 0.61mm/px · z∈[-32,+242]mm · 12 of 65 slices shown, 15 images]
[im 5/65  mediastinal]
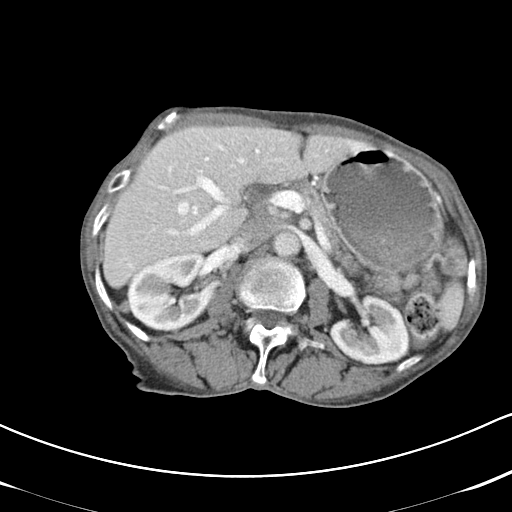
[im 5/65  lung]
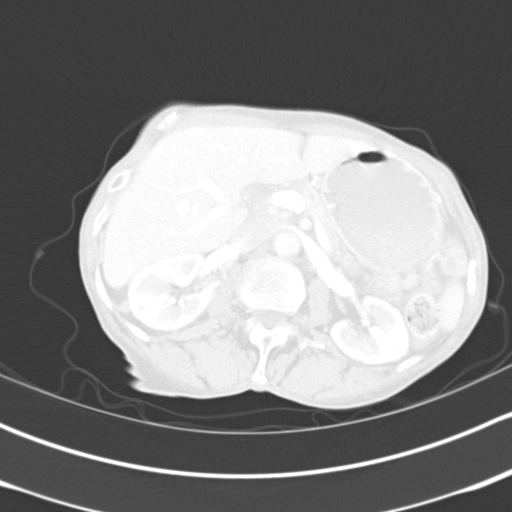
[im 10/65  lung]
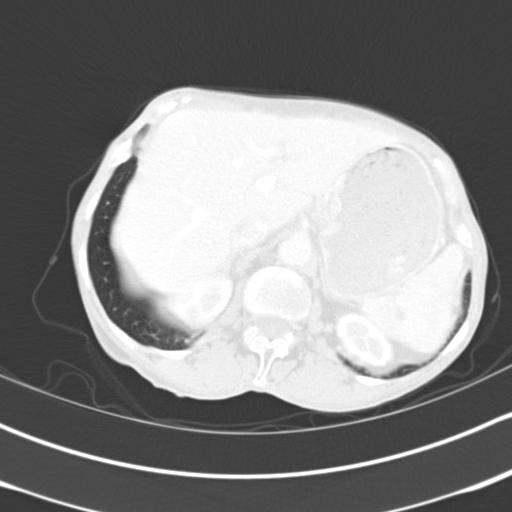
[im 15/65  lung]
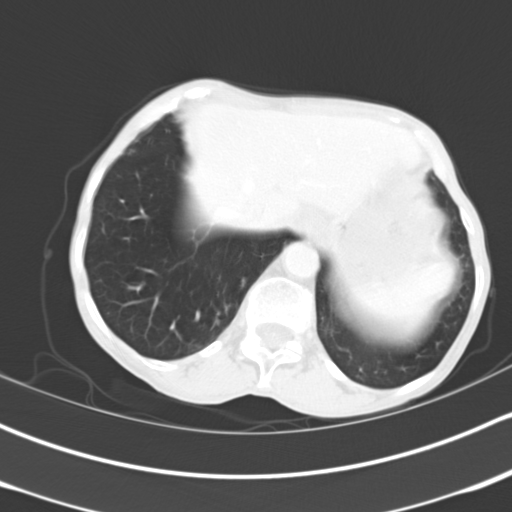
[im 19/65  lung]
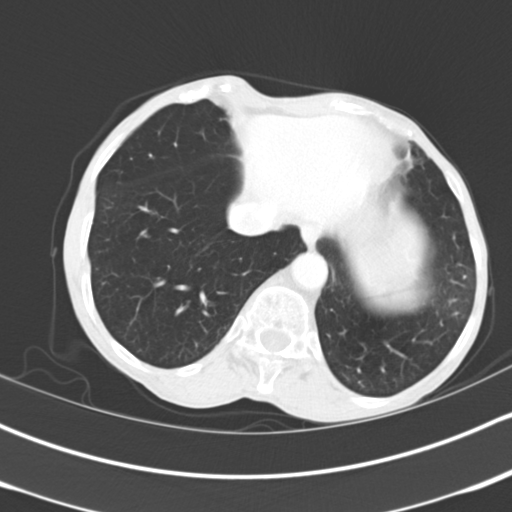
[im 24/65  mediastinal]
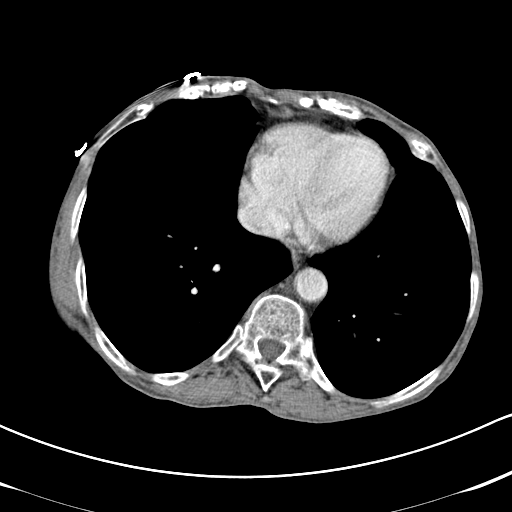
[im 24/65  lung]
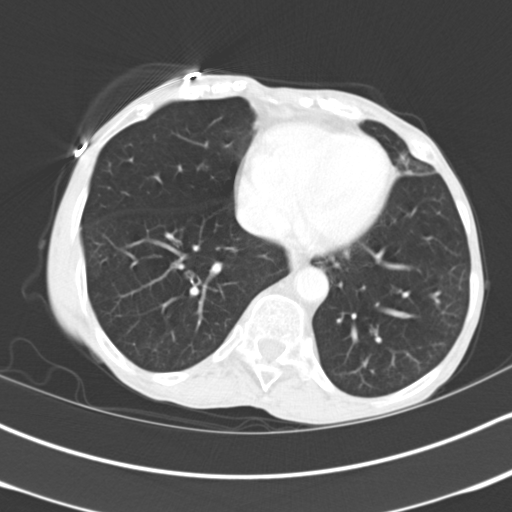
[im 29/65  lung]
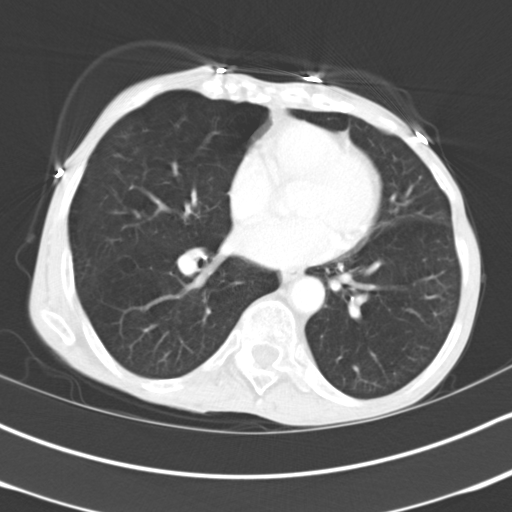
[im 36/65  lung]
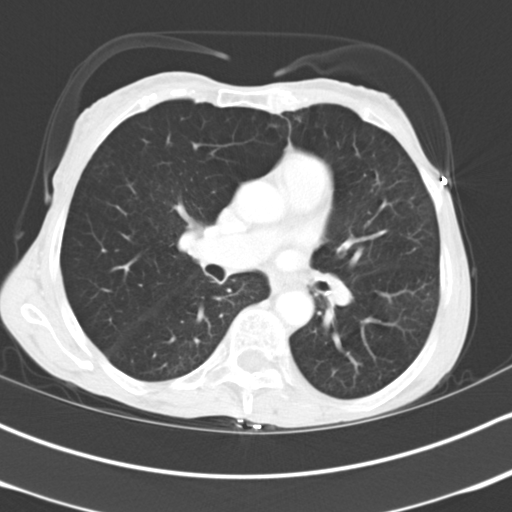
[im 41/65  lung]
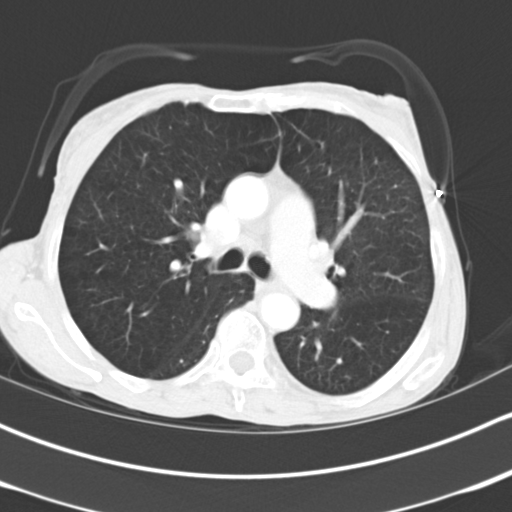
[im 46/65  mediastinal]
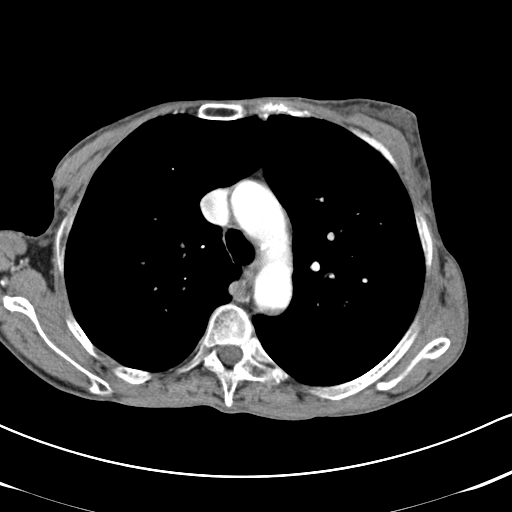
[im 46/65  lung]
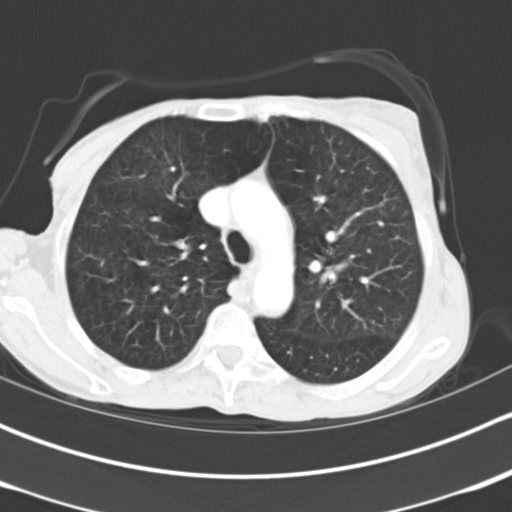
[im 50/65  lung]
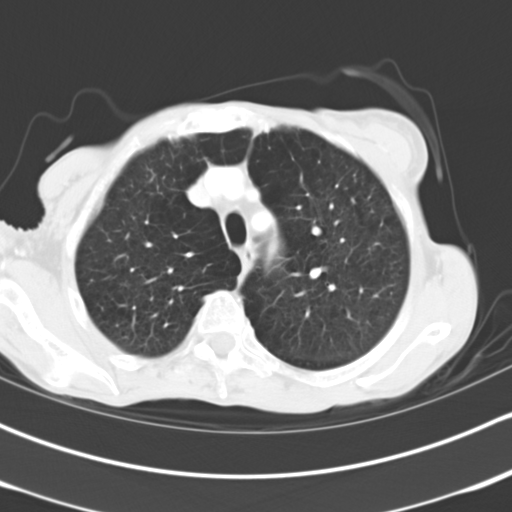
[im 55/65  lung]
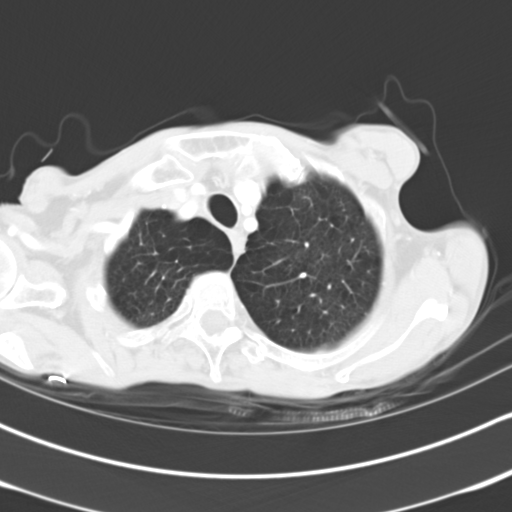
[im 60/65  lung]
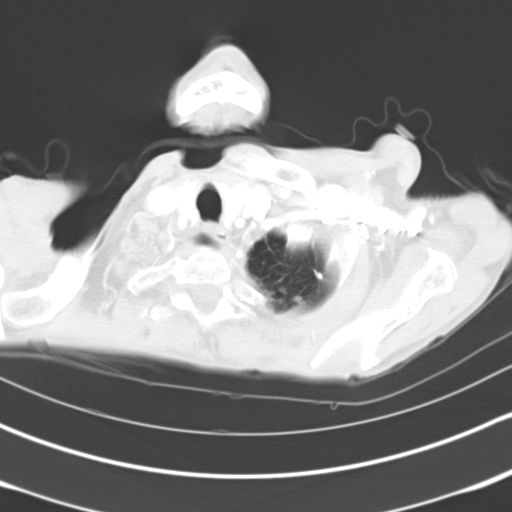

[Series 602: <mpr thick range> · coronal · 0.64mm/px · 3 of 102 slices shown]
[im 21/102  lung]
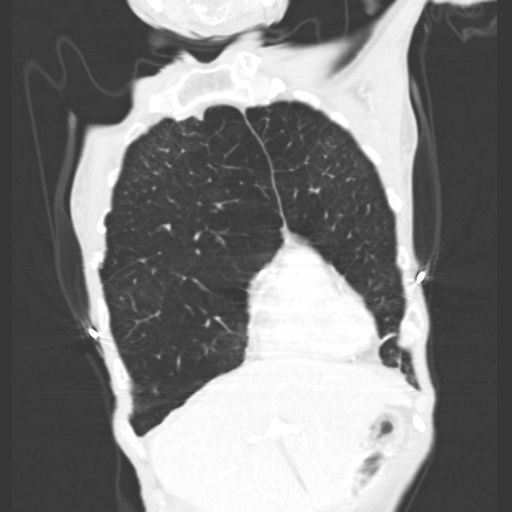
[im 41/102  lung]
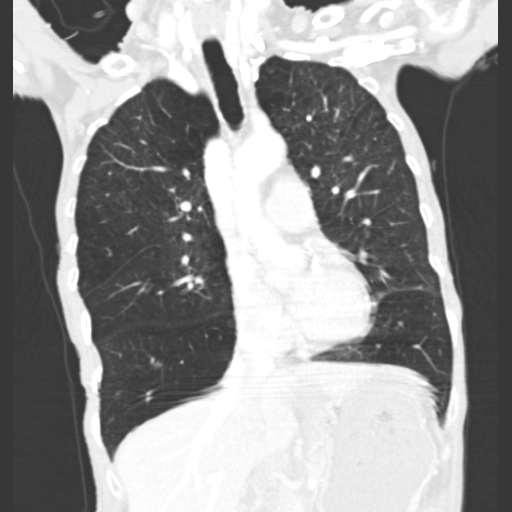
[im 61/102  lung]
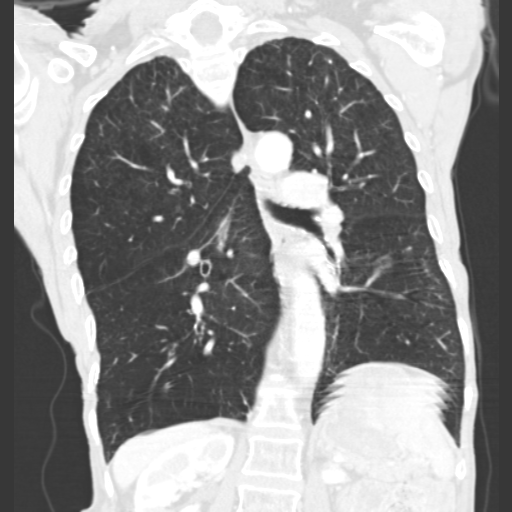

[15 of 36 positions shown; findings below may reference images not displayed]

FINDINGS: Mediastinum / Lymph Nodes: There is no axillary lymphadenopathy. No
mediastinal lymphadenopathy. There is no hilar lymphadenopathy.
Heart size is normal. Coronary artery calcification is noted. No
pericardial effusion.

Lungs / Pleura: Moderate changes of emphysema noted bilaterally. 7
mm nodule along the minor fissure has a platelike configuration on
sagittal reformation is (see image 28 series 603) and was 6 mm on
the study from 6 years ago. There is some scarring in the lingula,
unchanged in the interval. No evidence for focal airspace
consolidation or nodule/mass lesion in the left lung base. No
evidence for pulmonary edema. No pleural effusion.

[HOSPITAL] / Soft Tissues: Bone windows reveal no worrisome lytic or
sclerotic osseous lesions.

Upper Abdomen:  Unremarkable.
IMPRESSION: There is no evidence for a pulmonary nodule or mass in the left lung
base to account for the finding on the recent chest x-ray. Likely
this appearance on x-ray is related to some scarring in the lingula
and overlying anterior rib end.

Emphysema.

Coronary artery atherosclerosis.

## 2016-01-24 DIAGNOSIS — M6281 Muscle weakness (generalized): Secondary | ICD-10-CM | POA: Diagnosis not present

## 2016-01-25 DIAGNOSIS — M6281 Muscle weakness (generalized): Secondary | ICD-10-CM | POA: Diagnosis not present

## 2016-01-26 DIAGNOSIS — R05 Cough: Secondary | ICD-10-CM | POA: Diagnosis not present

## 2016-01-26 DIAGNOSIS — R410 Disorientation, unspecified: Secondary | ICD-10-CM | POA: Diagnosis not present

## 2016-01-26 DIAGNOSIS — J449 Chronic obstructive pulmonary disease, unspecified: Secondary | ICD-10-CM | POA: Diagnosis not present

## 2016-01-27 DIAGNOSIS — M6281 Muscle weakness (generalized): Secondary | ICD-10-CM | POA: Diagnosis not present

## 2016-01-28 DIAGNOSIS — M6281 Muscle weakness (generalized): Secondary | ICD-10-CM | POA: Diagnosis not present

## 2016-01-29 DIAGNOSIS — M6281 Muscle weakness (generalized): Secondary | ICD-10-CM | POA: Diagnosis not present

## 2016-01-31 DIAGNOSIS — M6281 Muscle weakness (generalized): Secondary | ICD-10-CM | POA: Diagnosis not present

## 2016-02-01 DIAGNOSIS — M6281 Muscle weakness (generalized): Secondary | ICD-10-CM | POA: Diagnosis not present

## 2016-02-02 DIAGNOSIS — M6281 Muscle weakness (generalized): Secondary | ICD-10-CM | POA: Diagnosis not present

## 2016-02-03 DIAGNOSIS — M6281 Muscle weakness (generalized): Secondary | ICD-10-CM | POA: Diagnosis not present

## 2016-02-04 DIAGNOSIS — M6281 Muscle weakness (generalized): Secondary | ICD-10-CM | POA: Diagnosis not present

## 2016-02-07 DIAGNOSIS — M6281 Muscle weakness (generalized): Secondary | ICD-10-CM | POA: Diagnosis not present

## 2016-02-08 DIAGNOSIS — M6281 Muscle weakness (generalized): Secondary | ICD-10-CM | POA: Diagnosis not present

## 2016-02-09 DIAGNOSIS — M6281 Muscle weakness (generalized): Secondary | ICD-10-CM | POA: Diagnosis not present

## 2016-02-10 DIAGNOSIS — M6281 Muscle weakness (generalized): Secondary | ICD-10-CM | POA: Diagnosis not present

## 2016-02-11 DIAGNOSIS — M6281 Muscle weakness (generalized): Secondary | ICD-10-CM | POA: Diagnosis not present

## 2016-02-14 DIAGNOSIS — M6281 Muscle weakness (generalized): Secondary | ICD-10-CM | POA: Diagnosis not present

## 2016-02-15 DIAGNOSIS — M6281 Muscle weakness (generalized): Secondary | ICD-10-CM | POA: Diagnosis not present

## 2016-02-16 DIAGNOSIS — M6281 Muscle weakness (generalized): Secondary | ICD-10-CM | POA: Diagnosis not present

## 2016-02-21 DIAGNOSIS — R262 Difficulty in walking, not elsewhere classified: Secondary | ICD-10-CM | POA: Diagnosis not present

## 2016-02-21 DIAGNOSIS — R296 Repeated falls: Secondary | ICD-10-CM | POA: Diagnosis not present

## 2016-02-21 DIAGNOSIS — R1311 Dysphagia, oral phase: Secondary | ICD-10-CM | POA: Diagnosis not present

## 2016-02-21 DIAGNOSIS — R4182 Altered mental status, unspecified: Secondary | ICD-10-CM | POA: Diagnosis not present

## 2016-02-21 DIAGNOSIS — R278 Other lack of coordination: Secondary | ICD-10-CM | POA: Diagnosis not present

## 2016-02-21 DIAGNOSIS — F0281 Dementia in other diseases classified elsewhere with behavioral disturbance: Secondary | ICD-10-CM | POA: Diagnosis not present

## 2016-02-21 DIAGNOSIS — Z9181 History of falling: Secondary | ICD-10-CM | POA: Diagnosis not present

## 2016-02-22 DIAGNOSIS — R1311 Dysphagia, oral phase: Secondary | ICD-10-CM | POA: Diagnosis not present

## 2016-02-22 DIAGNOSIS — R296 Repeated falls: Secondary | ICD-10-CM | POA: Diagnosis not present

## 2016-02-22 DIAGNOSIS — F0281 Dementia in other diseases classified elsewhere with behavioral disturbance: Secondary | ICD-10-CM | POA: Diagnosis not present

## 2016-02-22 DIAGNOSIS — Z9181 History of falling: Secondary | ICD-10-CM | POA: Diagnosis not present

## 2016-02-22 DIAGNOSIS — R4182 Altered mental status, unspecified: Secondary | ICD-10-CM | POA: Diagnosis not present

## 2016-02-22 DIAGNOSIS — R278 Other lack of coordination: Secondary | ICD-10-CM | POA: Diagnosis not present

## 2016-02-22 DIAGNOSIS — R262 Difficulty in walking, not elsewhere classified: Secondary | ICD-10-CM | POA: Diagnosis not present

## 2016-02-23 DIAGNOSIS — R278 Other lack of coordination: Secondary | ICD-10-CM | POA: Diagnosis not present

## 2016-02-23 DIAGNOSIS — R4182 Altered mental status, unspecified: Secondary | ICD-10-CM | POA: Diagnosis not present

## 2016-02-23 DIAGNOSIS — F0281 Dementia in other diseases classified elsewhere with behavioral disturbance: Secondary | ICD-10-CM | POA: Diagnosis not present

## 2016-02-23 DIAGNOSIS — Z9181 History of falling: Secondary | ICD-10-CM | POA: Diagnosis not present

## 2016-02-23 DIAGNOSIS — R262 Difficulty in walking, not elsewhere classified: Secondary | ICD-10-CM | POA: Diagnosis not present

## 2016-02-23 DIAGNOSIS — R1311 Dysphagia, oral phase: Secondary | ICD-10-CM | POA: Diagnosis not present

## 2016-02-23 DIAGNOSIS — R296 Repeated falls: Secondary | ICD-10-CM | POA: Diagnosis not present

## 2016-02-24 DIAGNOSIS — R1311 Dysphagia, oral phase: Secondary | ICD-10-CM | POA: Diagnosis not present

## 2016-02-24 DIAGNOSIS — R262 Difficulty in walking, not elsewhere classified: Secondary | ICD-10-CM | POA: Diagnosis not present

## 2016-02-24 DIAGNOSIS — Z9181 History of falling: Secondary | ICD-10-CM | POA: Diagnosis not present

## 2016-02-24 DIAGNOSIS — R4182 Altered mental status, unspecified: Secondary | ICD-10-CM | POA: Diagnosis not present

## 2016-02-24 DIAGNOSIS — F0281 Dementia in other diseases classified elsewhere with behavioral disturbance: Secondary | ICD-10-CM | POA: Diagnosis not present

## 2016-02-24 DIAGNOSIS — R296 Repeated falls: Secondary | ICD-10-CM | POA: Diagnosis not present

## 2016-02-24 DIAGNOSIS — R278 Other lack of coordination: Secondary | ICD-10-CM | POA: Diagnosis not present

## 2016-02-25 DIAGNOSIS — Z9181 History of falling: Secondary | ICD-10-CM | POA: Diagnosis not present

## 2016-02-25 DIAGNOSIS — F0281 Dementia in other diseases classified elsewhere with behavioral disturbance: Secondary | ICD-10-CM | POA: Diagnosis not present

## 2016-02-25 DIAGNOSIS — R4182 Altered mental status, unspecified: Secondary | ICD-10-CM | POA: Diagnosis not present

## 2016-02-25 DIAGNOSIS — R296 Repeated falls: Secondary | ICD-10-CM | POA: Diagnosis not present

## 2016-02-25 DIAGNOSIS — R1311 Dysphagia, oral phase: Secondary | ICD-10-CM | POA: Diagnosis not present

## 2016-02-25 DIAGNOSIS — R262 Difficulty in walking, not elsewhere classified: Secondary | ICD-10-CM | POA: Diagnosis not present

## 2016-02-25 DIAGNOSIS — R278 Other lack of coordination: Secondary | ICD-10-CM | POA: Diagnosis not present

## 2016-02-28 DIAGNOSIS — R296 Repeated falls: Secondary | ICD-10-CM | POA: Diagnosis not present

## 2016-02-28 DIAGNOSIS — R4182 Altered mental status, unspecified: Secondary | ICD-10-CM | POA: Diagnosis not present

## 2016-02-28 DIAGNOSIS — F0281 Dementia in other diseases classified elsewhere with behavioral disturbance: Secondary | ICD-10-CM | POA: Diagnosis not present

## 2016-02-28 DIAGNOSIS — R1311 Dysphagia, oral phase: Secondary | ICD-10-CM | POA: Diagnosis not present

## 2016-02-28 DIAGNOSIS — R278 Other lack of coordination: Secondary | ICD-10-CM | POA: Diagnosis not present

## 2016-02-28 DIAGNOSIS — R262 Difficulty in walking, not elsewhere classified: Secondary | ICD-10-CM | POA: Diagnosis not present

## 2016-02-28 DIAGNOSIS — Z9181 History of falling: Secondary | ICD-10-CM | POA: Diagnosis not present

## 2016-02-29 DIAGNOSIS — R278 Other lack of coordination: Secondary | ICD-10-CM | POA: Diagnosis not present

## 2016-02-29 DIAGNOSIS — R296 Repeated falls: Secondary | ICD-10-CM | POA: Diagnosis not present

## 2016-02-29 DIAGNOSIS — R4182 Altered mental status, unspecified: Secondary | ICD-10-CM | POA: Diagnosis not present

## 2016-02-29 DIAGNOSIS — R262 Difficulty in walking, not elsewhere classified: Secondary | ICD-10-CM | POA: Diagnosis not present

## 2016-02-29 DIAGNOSIS — F0281 Dementia in other diseases classified elsewhere with behavioral disturbance: Secondary | ICD-10-CM | POA: Diagnosis not present

## 2016-02-29 DIAGNOSIS — Z9181 History of falling: Secondary | ICD-10-CM | POA: Diagnosis not present

## 2016-02-29 DIAGNOSIS — R1311 Dysphagia, oral phase: Secondary | ICD-10-CM | POA: Diagnosis not present

## 2016-03-01 DIAGNOSIS — R1311 Dysphagia, oral phase: Secondary | ICD-10-CM | POA: Diagnosis not present

## 2016-03-01 DIAGNOSIS — R262 Difficulty in walking, not elsewhere classified: Secondary | ICD-10-CM | POA: Diagnosis not present

## 2016-03-01 DIAGNOSIS — R278 Other lack of coordination: Secondary | ICD-10-CM | POA: Diagnosis not present

## 2016-03-01 DIAGNOSIS — F0281 Dementia in other diseases classified elsewhere with behavioral disturbance: Secondary | ICD-10-CM | POA: Diagnosis not present

## 2016-03-01 DIAGNOSIS — R4182 Altered mental status, unspecified: Secondary | ICD-10-CM | POA: Diagnosis not present

## 2016-03-01 DIAGNOSIS — Z9181 History of falling: Secondary | ICD-10-CM | POA: Diagnosis not present

## 2016-03-01 DIAGNOSIS — R296 Repeated falls: Secondary | ICD-10-CM | POA: Diagnosis not present

## 2016-03-02 DIAGNOSIS — R4182 Altered mental status, unspecified: Secondary | ICD-10-CM | POA: Diagnosis not present

## 2016-03-02 DIAGNOSIS — Z9181 History of falling: Secondary | ICD-10-CM | POA: Diagnosis not present

## 2016-03-02 DIAGNOSIS — R262 Difficulty in walking, not elsewhere classified: Secondary | ICD-10-CM | POA: Diagnosis not present

## 2016-03-02 DIAGNOSIS — F0281 Dementia in other diseases classified elsewhere with behavioral disturbance: Secondary | ICD-10-CM | POA: Diagnosis not present

## 2016-03-02 DIAGNOSIS — R1311 Dysphagia, oral phase: Secondary | ICD-10-CM | POA: Diagnosis not present

## 2016-03-02 DIAGNOSIS — R296 Repeated falls: Secondary | ICD-10-CM | POA: Diagnosis not present

## 2016-03-02 DIAGNOSIS — R278 Other lack of coordination: Secondary | ICD-10-CM | POA: Diagnosis not present

## 2016-03-06 DIAGNOSIS — J449 Chronic obstructive pulmonary disease, unspecified: Secondary | ICD-10-CM | POA: Diagnosis not present

## 2016-03-06 DIAGNOSIS — M79671 Pain in right foot: Secondary | ICD-10-CM | POA: Diagnosis not present

## 2016-03-06 DIAGNOSIS — B351 Tinea unguium: Secondary | ICD-10-CM | POA: Diagnosis not present

## 2016-03-06 DIAGNOSIS — M79672 Pain in left foot: Secondary | ICD-10-CM | POA: Diagnosis not present

## 2016-03-06 DIAGNOSIS — R509 Fever, unspecified: Secondary | ICD-10-CM | POA: Diagnosis not present

## 2016-03-07 ENCOUNTER — Encounter (HOSPITAL_COMMUNITY): Payer: Self-pay

## 2016-03-07 ENCOUNTER — Emergency Department (HOSPITAL_COMMUNITY)
Admission: EM | Admit: 2016-03-07 | Discharge: 2016-03-07 | Disposition: A | Discharge status: Home / Self Care | Payer: Medicare Other | Attending: Emergency Medicine | Admitting: Emergency Medicine

## 2016-03-07 ENCOUNTER — Emergency Department (HOSPITAL_COMMUNITY): Payer: Medicare Other

## 2016-03-07 DIAGNOSIS — Z9981 Dependence on supplemental oxygen: Secondary | ICD-10-CM | POA: Insufficient documentation

## 2016-03-07 DIAGNOSIS — Z9841 Cataract extraction status, right eye: Secondary | ICD-10-CM | POA: Diagnosis not present

## 2016-03-07 DIAGNOSIS — Z9842 Cataract extraction status, left eye: Secondary | ICD-10-CM | POA: Insufficient documentation

## 2016-03-07 DIAGNOSIS — M8580 Other specified disorders of bone density and structure, unspecified site: Secondary | ICD-10-CM | POA: Insufficient documentation

## 2016-03-07 DIAGNOSIS — Z8719 Personal history of other diseases of the digestive system: Secondary | ICD-10-CM | POA: Insufficient documentation

## 2016-03-07 DIAGNOSIS — Z7951 Long term (current) use of inhaled steroids: Secondary | ICD-10-CM | POA: Diagnosis not present

## 2016-03-07 DIAGNOSIS — R402411 Glasgow coma scale score 13-15, in the field [EMT or ambulance]: Secondary | ICD-10-CM | POA: Diagnosis not present

## 2016-03-07 DIAGNOSIS — F17210 Nicotine dependence, cigarettes, uncomplicated: Secondary | ICD-10-CM | POA: Diagnosis not present

## 2016-03-07 DIAGNOSIS — R4182 Altered mental status, unspecified: Secondary | ICD-10-CM | POA: Diagnosis not present

## 2016-03-07 DIAGNOSIS — M1990 Unspecified osteoarthritis, unspecified site: Secondary | ICD-10-CM | POA: Insufficient documentation

## 2016-03-07 DIAGNOSIS — Z79899 Other long term (current) drug therapy: Secondary | ICD-10-CM | POA: Insufficient documentation

## 2016-03-07 DIAGNOSIS — F1027 Alcohol dependence with alcohol-induced persisting dementia: Secondary | ICD-10-CM | POA: Insufficient documentation

## 2016-03-07 DIAGNOSIS — R0602 Shortness of breath: Secondary | ICD-10-CM | POA: Diagnosis not present

## 2016-03-07 DIAGNOSIS — Z86718 Personal history of other venous thrombosis and embolism: Secondary | ICD-10-CM | POA: Insufficient documentation

## 2016-03-07 DIAGNOSIS — J441 Chronic obstructive pulmonary disease with (acute) exacerbation: Secondary | ICD-10-CM | POA: Diagnosis not present

## 2016-03-07 LAB — CBC WITH DIFFERENTIAL/PLATELET
BASOS PCT: 0 %
Basophils Absolute: 0 10*3/uL (ref 0.0–0.1)
EOS ABS: 0.2 10*3/uL (ref 0.0–0.7)
EOS PCT: 3 %
HCT: 35.5 % — ABNORMAL LOW (ref 36.0–46.0)
HEMOGLOBIN: 11.4 g/dL — AB (ref 12.0–15.0)
LYMPHS PCT: 26 %
Lymphs Abs: 1.8 10*3/uL (ref 0.7–4.0)
MCH: 25.9 pg — AB (ref 26.0–34.0)
MCHC: 32.1 g/dL (ref 30.0–36.0)
MCV: 80.5 fL (ref 78.0–100.0)
MONO ABS: 0.6 10*3/uL (ref 0.1–1.0)
Monocytes Relative: 9 %
NEUTROS PCT: 62 %
Neutro Abs: 4.2 10*3/uL (ref 1.7–7.7)
PLATELETS: 124 10*3/uL — AB (ref 150–400)
RBC: 4.41 MIL/uL (ref 3.87–5.11)
RDW: 16.7 % — ABNORMAL HIGH (ref 11.5–15.5)
WBC: 6.8 10*3/uL (ref 4.0–10.5)

## 2016-03-07 LAB — BASIC METABOLIC PANEL
ANION GAP: 12 (ref 5–15)
BUN: 27 mg/dL — ABNORMAL HIGH (ref 6–20)
CO2: 33 mmol/L — AB (ref 22–32)
Calcium: 9.9 mg/dL (ref 8.9–10.3)
Chloride: 99 mmol/L — ABNORMAL LOW (ref 101–111)
Creatinine, Ser: 0.88 mg/dL (ref 0.44–1.00)
GLUCOSE: 94 mg/dL (ref 65–99)
POTASSIUM: 4.4 mmol/L (ref 3.5–5.1)
SODIUM: 144 mmol/L (ref 135–145)

## 2016-03-07 LAB — URINE MICROSCOPIC-ADD ON

## 2016-03-07 LAB — URINALYSIS, ROUTINE W REFLEX MICROSCOPIC
Bilirubin Urine: NEGATIVE
Glucose, UA: NEGATIVE mg/dL
HGB URINE DIPSTICK: NEGATIVE
Ketones, ur: NEGATIVE mg/dL
LEUKOCYTES UA: NEGATIVE
Nitrite: NEGATIVE
PROTEIN: 30 mg/dL — AB
SPECIFIC GRAVITY, URINE: 1.025 (ref 1.005–1.030)
pH: 6 (ref 5.0–8.0)

## 2016-03-07 MED ORDER — INSULIN ASPART 100 UNIT/ML IV SOLN
10.0000 [IU] | Freq: Once | INTRAVENOUS | Status: AC
  Administered 2016-03-07: 10 [IU] via INTRAVENOUS
  Filled 2016-03-07: qty 1

## 2016-03-07 MED ORDER — CALCIUM GLUCONATE 10 % IV SOLN
2.0000 g | Freq: Once | INTRAVENOUS | Status: AC
  Administered 2016-03-07: 2 g via INTRAVENOUS
  Filled 2016-03-07: qty 20

## 2016-03-07 MED ORDER — DEXTROSE 50 % IV SOLN
2.0000 | Freq: Once | INTRAVENOUS | Status: AC
  Administered 2016-03-07: 100 mL via INTRAVENOUS
  Filled 2016-03-07: qty 100

## 2016-03-07 NOTE — Discharge Instructions (Signed)
Shortness of Breath [name], be sure to wear oxygen at all times.  See your primary doctor within 3 days for close follow up.  If symptoms worsen, come back to the ED immediately. Thank you. Shortness of breath means you have trouble breathing. Shortness of breath needs medical care right away. HOME CARE   Do not smoke.  Avoid being around chemicals or things (paint fumes, dust) that may bother your breathing.  Rest as needed. Slowly begin your normal activities.  Only take medicines as told by your doctor.  Keep all doctor visits as told. GET HELP RIGHT AWAY IF:   Your shortness of breath gets worse.  You feel lightheaded, pass out (faint), or have a cough that is not helped by medicine.  You cough up blood.  You have pain with breathing.  You have pain in your chest, arms, shoulders, or belly (abdomen).  You have a fever.  You cannot walk up stairs or exercise the way you normally do.  You do not get better in the time expected.  You have a hard time doing normal activities even with rest.  You have problems with your medicines.  You have any new symptoms. MAKE SURE YOU:  Understand these instructions.  Will watch your condition.  Will get help right away if you are not doing well or get worse.   This information is not intended to replace advice given to you by your health care provider. Make sure you discuss any questions you have with your health care provider.   Document Released: 03/27/2008 Document Revised: 10/14/2013 Document Reviewed: 12/25/2011 Elsevier Interactive Patient Education [employer]2016 [employer]

## 2016-03-07 NOTE — ED Provider Notes (Addendum)
CSN: [id number]     Arrival date & time 03/07/16  0135 History  By signing my name below, I, Bethel BornBritney McCollum, attest that this documentation has been prepared under the direction and in the presence of Tomasita CrumbleAdeleke Vilma Will, MD. Electronically Signed: Bethel BornBritney McCollum, ED Scribe. 03/07/2016. 1:48 AM    Chief Complaint  Patient presents with  . Altered Mental Status  . Shortness of Breath   LEVEL V CAVEAT DUE TO DEMENTIA  The history is provided by the EMS personnel. The history is limited by the condition of the patient. No language interpreter was used.   [facility], [name] is a [age] y.o. female with PMHx of dementia who presents to the Emergency Department for evaluation of SOB and AMS for 3 days.  Pt denies any pain.   Past Medical History  Diagnosis Date  . COPD (chronic obstructive pulmonary disease) (HCC)   . 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 H/O ETOH abuse   . Oxygen dependent     2l per nasal cannula  . GERD (gastroesophageal reflux disease)   . Osteopenia   . Arthritis   . Dementia   . Cataracts, bilateral   . Hx of blood clots   . Amnesia    Past Surgical History  Procedure Laterality Date  . Hip surgery    . Fracture surgery    . Cataract extraction w/phaco Right 01/08/2013    Procedure: PHACOEMULSIFICATION, POSTERIOR CHAMBER INTRAOCULAR LENS PLACEMENT;  Surgeon: Shade FloodGreer Geiger, MD;  [facility];  Service: Ophthalmology;  Laterality: Right;  ATTEMPTED LOCAL ANESTHESIA; PATIENT RE-PREPPED AND DRAPED FOR GENERAL ANESTHESIA   Family History  Problem Relation Age of Onset  . Emphysema Sister     smoker  . COPD Sister   . Alzheimer's disease Sister   . Asthma Daughter   . Heart disease Father   . Deep vein thrombosis Daughter   . COPD Mother   . Alzheimer's disease Mother   . Heart disease Brother   . Alzheimer's disease Sister   . COPD Sister    Social History  Substance Use Topics  . Smoking status: Current Some Day  Smoker -- 0.50 packs/day for 50 years    Types: Cigarettes  . Smokeless tobacco: Never Used  . Alcohol Use: No     Comment: H/O abuse.  None now.   OB History    No data available     Review of Systems  Unable to perform ROS: Dementia    Allergies  Review of patient's allergies indicates no known allergies.  Home Medications   Prior to Admission medications   Medication Sig Start Date End Date Taking? Authorizing Provider  albuterol (PROAIR HFA) 108 (90 BASE) MCG/ACT inhaler Inhale 2 puffs into the lungs every 6 (six) hours as needed for wheezing or shortness of breath.     Historical Provider, MD  albuterol (PROVENTIL) (2.5 MG/3ML) 0.083% nebulizer solution Take 2.5 mg by nebulization every 6 (six) hours as needed for shortness of breath.     Historical Provider, MD  arformoterol (BROVANA) 15 MCG/2ML NEBU Take 15 mcg by nebulization 2 (two) times daily.      Historical Provider, MD  benzonatate (TESSALON) 100 MG capsule Take 200 mg by mouth 2 (two) times daily.     Historical Provider, MD  budesonide (PULMICORT) 0.5 MG/2ML nebulizer solution Take 0.5 mg by nebulization 2 (two) times daily.     Historical Provider, MD  cetirizine (ZYRTEC) 10 MG tablet Take 10 mg by mouth every morning.     Historical Provider, MD  Cholecalciferol (VITAMIN D PO) Take by mouth daily.    Historical Provider, MD  famotidine (PEPCID) 20 MG tablet Take 20 mg by mouth 2 (two) times daily.     Historical Provider, MD  FERROUS SULFATE PO Take by mouth daily.    Historical Provider, MD  folic acid (FOLVITE) 1 MG tablet Take 1 mg by mouth every morning.     Historical Provider, MD  ipratropium (ATROVENT HFA) 17 MCG/ACT inhaler Inhale 2 puffs into the lungs every 6 (six) hours as needed.     Historical Provider, MD  megestrol (MEGACE) 400 MG/10ML suspension Take 20 mLs (800 mg total) by mouth daily. 04/22/15   York Spaniel, MD  meloxicam (MOBIC) 7.5 MG tablet Take 7.5 mg by mouth daily.    Historical  Provider, MD  memantine (NAMENDA XR) 28 MG CP24 24 hr capsule Take 28 mg by mouth daily.    Historical Provider, MD  mirtazapine (REMERON) 15 MG tablet Take 15 mg by mouth at bedtime.    Historical Provider, MD  Multiple Vitamin (MULTIVITAMIN) tablet Take 1 tablet by mouth daily.    Historical Provider, MD  Polyethyl Glycol-Propyl Glycol (SYSTANE OP) Place 1 drop into the right eye daily as needed (for dry eyes).     Historical Provider, MD  tiotropium (SPIRIVA) 18 MCG inhalation capsule Place 18 mcg into inhaler and inhale every morning.     Historical Provider, MD   BP 120/72 mmHg  Pulse 101  Temp(Src) 99.2 F (37.3 C) (Oral)  Resp 22  SpO2 96% Physical Exam  Constitutional: She appears well-developed and well-nourished. No distress.  HENT:  Head: Normocephalic and atraumatic.  Nose: Nose normal.  Mouth/Throat: Oropharynx is clear and moist. No oropharyngeal exudate.  Eyes: Conjunctivae and EOM are normal. Pupils are equal, round, and reactive to light. No scleral icterus.  Neck: Normal range of motion. Neck supple. No JVD present. No tracheal deviation present. No thyromegaly present.  Cardiovascular: Normal rate, regular rhythm and normal heart sounds.  Exam reveals no gallop and no friction rub.   No murmur heard. Pulmonary/Chest: Effort normal and breath sounds normal. No respiratory distress. She has no wheezes. She exhibits no tenderness.  Abdominal: Soft. Bowel sounds are normal. She exhibits no distension and no mass. There is no tenderness. There is no rebound and no guarding.  Musculoskeletal: Normal range of motion. She exhibits no edema or tenderness.  Lymphadenopathy:    She has no cervical adenopathy.  Neurological: She is alert. No cranial nerve deficit. She exhibits normal muscle tone.  Skin: Skin is warm and dry. No rash noted. No erythema. No pallor.  Nursing note and vitals reviewed.   ED Course  Procedures (including critical care time) DIAGNOSTIC  STUDIES: Oxygen Saturation is 96% on 2L, adequate by my interpretation.    COORDINATION OF CARE: 1:45 AM Treatment plan includes lab work, CXR, EKG .  Labs Review Labs Reviewed  CBC WITH DIFFERENTIAL/PLATELET - Abnormal; Notable for the following:    Hemoglobin 11.4 (*)    HCT 35.5 (*)    MCH 25.9 (*)    RDW 16.7 (*)    Platelets 124 (*)    All other components within normal limits  URINALYSIS, ROUTINE W REFLEX MICROSCOPIC (NOT AT [facility]) - Abnormal; Notable for the following:    Protein, ur 30 (*)    All other components within normal  limits  BASIC METABOLIC PANEL - Abnormal; Notable for the following:    Chloride 99 (*)    CO2 33 (*)    BUN 27 (*)    All other components within normal limits  URINE MICROSCOPIC-ADD ON - Abnormal; Notable for the following:    Squamous Epithelial / LPF 0-5 (*)    Bacteria, UA RARE (*)    Casts HYALINE CASTS (*)    Crystals CA OXALATE CRYSTALS (*)    All other components within normal limits  GLUCOSE, RANDOM  I-STAT CHEM 8, ED    Imaging Review Dg Chest 1 View  03/07/2016  CLINICAL DATA:  Shortness of breath and altered mental status for 3 days. EXAM: CHEST 1 VIEW COMPARISON:  07/08/2015 FINDINGS: Normal heart size and pulmonary vascularity. Emphysematous changes in the lungs with diffuse interstitial pattern likely representing fibrosis. No focal airspace disease or consolidation. No blunting of costophrenic angles. No pneumothorax. Mediastinal contours appear intact. IMPRESSION: Emphysematous changes and scattered fibrosis in the lungs. No evidence of active pulmonary disease. Electronically Signed   By: Burman Nieves M.D.   On: 03/07/2016 04:22   I have personally reviewed and evaluated these images and lab results as part of my medical decision-making.   EKG Interpretation   Date/Time:  Tuesday Mar 07 2016 02:39:10 EDT Ventricular Rate:  100 PR Interval:    QRS Duration: 80 QT Interval:  309 QTC Calculation: 398 R Axis:   82 Text  Interpretation:  Artifact Normal sinus rhythm unable to compare  baseline Confirmed by Erroll Luna [phone] on 03/07/2016 3:42:43  AM      MDM   Final diagnoses:  Shortness of breath   Patient presents To the emergency department for altered mental status and shortness of breath per her nursing facility. My evaluation patient appears to be at her normal baseline. Family member in the room also states she has been in her normal baseline. I-STAT chem 8 initially showed potassium of 8.5. She was given calcium, insulin, dextrose. The metabolic panel sent to the main lab reveals a normal potassium of 4.4. Laboratory studies are unremarkable. Patient continues to be at her normal baseline, vital signs within her normal limits and she is safe for discharge.   I personally performed the services described in this documentation, which was scribed in my presence. The recorded information has been reviewed and is accurate.       Tomasita Crumble, MD 03/14/16 [phone]

## 2016-03-07 NOTE — ED Notes (Signed)
Pt comes from [facility] via [facility], c/o SOB and AMS x 3 days. Pt had xray and lab work done, pt nursing home is unable to find lab work. Pt alert and oriented to person.

## 2016-03-07 NOTE — ED Notes (Signed)
Patient transported to X-ray 

## 2016-03-08 DIAGNOSIS — F0390 Unspecified dementia without behavioral disturbance: Secondary | ICD-10-CM | POA: Diagnosis not present

## 2016-03-08 DIAGNOSIS — J449 Chronic obstructive pulmonary disease, unspecified: Secondary | ICD-10-CM | POA: Diagnosis not present

## 2016-03-09 DIAGNOSIS — R262 Difficulty in walking, not elsewhere classified: Secondary | ICD-10-CM | POA: Diagnosis not present

## 2016-03-09 DIAGNOSIS — Z9181 History of falling: Secondary | ICD-10-CM | POA: Diagnosis not present

## 2016-03-09 DIAGNOSIS — R1311 Dysphagia, oral phase: Secondary | ICD-10-CM | POA: Diagnosis not present

## 2016-03-09 DIAGNOSIS — R296 Repeated falls: Secondary | ICD-10-CM | POA: Diagnosis not present

## 2016-03-09 DIAGNOSIS — F0281 Dementia in other diseases classified elsewhere with behavioral disturbance: Secondary | ICD-10-CM | POA: Diagnosis not present

## 2016-03-09 DIAGNOSIS — R4182 Altered mental status, unspecified: Secondary | ICD-10-CM | POA: Diagnosis not present

## 2016-03-09 DIAGNOSIS — R278 Other lack of coordination: Secondary | ICD-10-CM | POA: Diagnosis not present

## 2016-03-10 ENCOUNTER — Encounter (HOSPITAL_COMMUNITY): Payer: Self-pay

## 2016-03-10 ENCOUNTER — Emergency Department (HOSPITAL_COMMUNITY): Payer: Medicare Other

## 2016-03-10 ENCOUNTER — Emergency Department (HOSPITAL_COMMUNITY)
Admission: EM | Admit: 2016-03-10 | Discharge: 2016-03-10 | Disposition: A | Discharge status: Home / Self Care | Payer: Medicare Other | Attending: Emergency Medicine | Admitting: Emergency Medicine

## 2016-03-10 DIAGNOSIS — M1990 Unspecified osteoarthritis, unspecified site: Secondary | ICD-10-CM | POA: Diagnosis not present

## 2016-03-10 DIAGNOSIS — R1311 Dysphagia, oral phase: Secondary | ICD-10-CM | POA: Diagnosis not present

## 2016-03-10 DIAGNOSIS — S0990XA Unspecified injury of head, initial encounter: Secondary | ICD-10-CM | POA: Diagnosis not present

## 2016-03-10 DIAGNOSIS — Y939 Activity, unspecified: Secondary | ICD-10-CM | POA: Insufficient documentation

## 2016-03-10 DIAGNOSIS — Y929 Unspecified place or not applicable: Secondary | ICD-10-CM | POA: Diagnosis not present

## 2016-03-10 DIAGNOSIS — F17210 Nicotine dependence, cigarettes, uncomplicated: Secondary | ICD-10-CM | POA: Diagnosis not present

## 2016-03-10 DIAGNOSIS — R262 Difficulty in walking, not elsewhere classified: Secondary | ICD-10-CM | POA: Diagnosis not present

## 2016-03-10 DIAGNOSIS — R296 Repeated falls: Secondary | ICD-10-CM | POA: Diagnosis not present

## 2016-03-10 DIAGNOSIS — R4182 Altered mental status, unspecified: Secondary | ICD-10-CM | POA: Diagnosis not present

## 2016-03-10 DIAGNOSIS — F0390 Unspecified dementia without behavioral disturbance: Secondary | ICD-10-CM | POA: Diagnosis not present

## 2016-03-10 DIAGNOSIS — W19XXXA Unspecified fall, initial encounter: Secondary | ICD-10-CM

## 2016-03-10 DIAGNOSIS — R278 Other lack of coordination: Secondary | ICD-10-CM | POA: Diagnosis not present

## 2016-03-10 DIAGNOSIS — Y999 Unspecified external cause status: Secondary | ICD-10-CM | POA: Insufficient documentation

## 2016-03-10 DIAGNOSIS — Z7951 Long term (current) use of inhaled steroids: Secondary | ICD-10-CM | POA: Insufficient documentation

## 2016-03-10 DIAGNOSIS — S0083XA Contusion of other part of head, initial encounter: Secondary | ICD-10-CM | POA: Diagnosis not present

## 2016-03-10 DIAGNOSIS — F0281 Dementia in other diseases classified elsewhere with behavioral disturbance: Secondary | ICD-10-CM | POA: Diagnosis not present

## 2016-03-10 DIAGNOSIS — J449 Chronic obstructive pulmonary disease, unspecified: Secondary | ICD-10-CM | POA: Insufficient documentation

## 2016-03-10 DIAGNOSIS — S064X1A Epidural hemorrhage with loss of consciousness of 30 minutes or less, initial encounter: Secondary | ICD-10-CM | POA: Diagnosis not present

## 2016-03-10 DIAGNOSIS — R259 Unspecified abnormal involuntary movements: Secondary | ICD-10-CM | POA: Diagnosis not present

## 2016-03-10 DIAGNOSIS — Z79899 Other long term (current) drug therapy: Secondary | ICD-10-CM | POA: Insufficient documentation

## 2016-03-10 DIAGNOSIS — Z9181 History of falling: Secondary | ICD-10-CM | POA: Diagnosis not present

## 2016-03-10 LAB — URINALYSIS, ROUTINE W REFLEX MICROSCOPIC
Bilirubin Urine: NEGATIVE
GLUCOSE, UA: NEGATIVE mg/dL
Hgb urine dipstick: NEGATIVE
KETONES UR: NEGATIVE mg/dL
LEUKOCYTES UA: NEGATIVE
NITRITE: NEGATIVE
PH: 7 (ref 5.0–8.0)
PROTEIN: NEGATIVE mg/dL
Specific Gravity, Urine: 1.023 (ref 1.005–1.030)

## 2016-03-10 NOTE — ED Notes (Signed)
Pt is awaiting PTAR to take her back to the nursing home

## 2016-03-10 NOTE — ED Notes (Signed)
[facility] Expected date:  Expected time:  Means of arrival:  Comments: EMS- 75yo F, fall/head injury/dementia

## 2016-03-10 NOTE — ED Notes (Signed)
Report called to [facility]. 

## 2016-03-10 NOTE — ED Provider Notes (Signed)
CSN: [id number]     Arrival date & time 03/10/16  1336 History   First MD Initiated Contact with Patient 03/10/16 1340     Chief Complaint  Patient presents with  . Fall  . Head Injury  PT HAS A HX OF DEMENTIA AND IS UNABLE TO GIVE ANY HX.  PT IS A RESIDENT OF [facility].  SHE FELL IN HER ROOM DURING HER NAP.  PT DOES HAVE A HEMATOMA TO HER LEFT FOREHEAD.   (Consider location/radiation/quality/duration/timing/severity/associated sxs/prior Treatment) Patient is a 75 y.o. female presenting with fall and head injury. The history is provided by the EMS personnel. The history is limited by the condition of the patient.  Fall This is a new problem. The current episode started 1 to 2 hours ago.  Head Injury   Past Medical History  Diagnosis Date  . COPD (chronic obstructive pulmonary disease) (HCC)   . 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 H/O ETOH abuse   . Oxygen dependent     2l per nasal cannula  . GERD (gastroesophageal reflux disease)   . Osteopenia   . Arthritis   . Dementia   . Cataracts, bilateral   . Hx of blood clots   . Amnesia    Past Surgical History  Procedure Laterality Date  . Hip surgery    . Fracture surgery    . Cataract extraction w/phaco Right 01/08/2013    Procedure: PHACOEMULSIFICATION, POSTERIOR CHAMBER INTRAOCULAR LENS PLACEMENT;  Surgeon: Shade Flood, MD;  Location: [facility] OR;  Service: Ophthalmology;  Laterality: Right;  ATTEMPTED LOCAL ANESTHESIA; PATIENT RE-PREPPED AND DRAPED FOR GENERAL ANESTHESIA   Family History  Problem Relation Age of Onset  . Emphysema Sister     smoker  . COPD Sister   . Alzheimer's disease Sister   . Asthma Daughter   . Heart disease Father   . Deep vein thrombosis Daughter   . COPD Mother   . Alzheimer's disease Mother   . Heart disease Brother   . Alzheimer's disease Sister   . COPD Sister    Social History  Substance Use Topics  . Smoking status: Current Some Day Smoker -- 0.50 packs/day for 50 years    Types:  Cigarettes  . Smokeless tobacco: Never Used  . Alcohol Use: No     Comment: H/O abuse.  None now.   OB History    No data available     Review of Systems  Unable to perform ROS: Dementia      Allergies  Review of patient's allergies indicates no known allergies.  Home Medications   Prior to Admission medications   Medication Sig Start Date End Date Taking? Authorizing Provider  albuterol (PROAIR HFA) 108 (90 BASE) MCG/ACT inhaler Inhale 2 puffs into the lungs every 6 (six) hours as needed for wheezing or shortness of breath.    Yes Historical Provider, MD  albuterol (PROVENTIL) (2.5 MG/3ML) 0.083% nebulizer solution Take 2.5 mg by nebulization every 6 (six) hours as needed for shortness of breath.    Yes Historical Provider, MD  arformoterol (BROVANA) 15 MCG/2ML NEBU Take 15 mcg by nebulization 2 (two) times daily.     Yes Historical Provider, MD  benzonatate (TESSALON) 100 MG capsule Take 100 mg by mouth daily as needed for cough.    Yes Historical Provider, MD  budesonide (PULMICORT) 0.5 MG/2ML nebulizer solution Take 0.5 mg by nebulization 2 (two) times daily.    Yes Historical Provider, MD  cetirizine (ZYRTEC) 10 MG  tablet Take 10 mg by mouth every morning.    Yes Historical Provider, MD  Cholecalciferol (VITAMIN D PO) Take 2,000 Units by mouth daily.    Yes Historical Provider, MD  divalproex (DEPAKOTE SPRINKLE) 125 MG capsule Take 125-500 mg by mouth 2 (two) times daily. 125 mg every day at noon 500 mg every night before bed   Yes Historical Provider, MD  famotidine (PEPCID) 20 MG tablet Take 20 mg by mouth daily.    Yes Historical Provider, MD  folic acid (FOLVITE) 1 MG tablet Take 1 mg by mouth every morning.    Yes Historical Provider, MD  megestrol (MEGACE) 400 MG/10ML suspension Take 20 mLs (800 mg total) by mouth daily. 04/22/15  Yes York Spaniel, MD  memantine (NAMENDA XR) 14 MG CP24 24 hr capsule Take 14 mg by mouth.   Yes Historical Provider, MD  mirtazapine  (REMERON) 15 MG tablet Take 15 mg by mouth at bedtime.   Yes Historical Provider, MD  Polyethyl Glycol-Propyl Glycol (SYSTANE OP) Place 1 drop into the right eye daily as needed (for dry eyes).    Yes Historical Provider, MD  tiotropium (SPIRIVA) 18 MCG inhalation capsule Place 18 mcg into inhaler and inhale every morning.    Yes Historical Provider, MD   BP 108/68 mmHg  Pulse 91  Temp(Src) 98 F (36.7 C) (Axillary)  Resp 18  SpO2 100% Physical Exam  Constitutional: She appears well-developed and well-nourished.  HENT:  Head:    Right Ear: External ear normal.  Left Ear: External ear normal.  Nose: Nose normal.  Mouth/Throat: Oropharynx is clear and moist.  Eyes: Conjunctivae are normal. Pupils are equal, round, and reactive to light.  Neck: Normal range of motion. Neck supple.  Cardiovascular: Normal rate, regular rhythm, normal heart sounds and intact distal pulses.   Pulmonary/Chest: Effort normal and breath sounds normal.  Abdominal: Soft. Bowel sounds are normal.  Musculoskeletal: Normal range of motion.  PT IS UNABLE TO TELL ME IF ANYTHING HURTS, BUT ALL BONES WERE PALPATED AND PT DOES NOT APPEAR TO HAVE ANY PAIN.  Neurological: She is alert.  PT WILL NOT FOLLOW COMMANDS, BUT SHE IS MOVING ALL 4 EXTREMITIES.  Skin: Skin is warm and dry.  Nursing note and vitals reviewed.   ED Course  Procedures (including critical care time) Labs Review Labs Reviewed  URINALYSIS, ROUTINE W REFLEX MICROSCOPIC (NOT AT [facility])    Imaging Review Ct Head Wo Contrast  03/10/2016  CLINICAL DATA:  Unwitnessed fall, hematoma to left side of forehead. History of dementia. EXAM: CT HEAD WITHOUT CONTRAST TECHNIQUE: Contiguous axial images were obtained from the base of the skull through the vertex without intravenous contrast. COMPARISON:  Head CT dated 05/27/2013. FINDINGS: Brain: There is mild generalized brain atrophy with commensurate dilatation of the ventricles and sulci. Chronic small vessel  ischemic changes noted within the deep periventricular white matter regions bilaterally. There is no mass, hemorrhage, edema, or other evidence of acute parenchymal abnormality. No extra-axial hemorrhage. Vascular: No hyperdense vessel or unexpected calcification. Skull: Focal soft tissue hematoma overlying the left frontal bone, measuring approximately 7 mm thickness. No underlying fracture. Sinuses/Orbits: No acute findings. Other: None. IMPRESSION: 1. Focal soft tissue hematoma overlying the left frontal bone. No underlying fracture. 2. No acute intracranial abnormality. No intracranial mass, hemorrhage or edema. Electronically Signed   By: Bary Richard M.D.   On: 03/10/2016 15:56   I have personally reviewed and evaluated these images and lab results as part of  my medical decision-making.   EKG Interpretation None      MDM  PT'S D/C REVIEWED WITH HER DAUGHTER.  SHE IS OK WITH PLAN.  Final diagnoses:  Accidental fall        Jacalyn LefevreJulie Nathaniel Wakeley, MD 03/10/16 1731

## 2016-03-10 NOTE — ED Notes (Signed)
Pt. Is unable to urinate at this time.  

## 2016-03-10 NOTE — ED Notes (Signed)
Per EMS, [facility] [place], presents after unwitnessed fall.  Hematoma to L side forehead.  Pt at neuro baseline per facility.  Nonverbal and nonambulatory.  Facility reports that Pt had laid down for a nap and was later found on the floor. Hx of dementia.

## 2016-03-13 DIAGNOSIS — R4182 Altered mental status, unspecified: Secondary | ICD-10-CM | POA: Diagnosis not present

## 2016-03-13 DIAGNOSIS — R296 Repeated falls: Secondary | ICD-10-CM | POA: Diagnosis not present

## 2016-03-13 DIAGNOSIS — R1311 Dysphagia, oral phase: Secondary | ICD-10-CM | POA: Diagnosis not present

## 2016-03-13 DIAGNOSIS — Z9181 History of falling: Secondary | ICD-10-CM | POA: Diagnosis not present

## 2016-03-13 DIAGNOSIS — R262 Difficulty in walking, not elsewhere classified: Secondary | ICD-10-CM | POA: Diagnosis not present

## 2016-03-13 DIAGNOSIS — F0281 Dementia in other diseases classified elsewhere with behavioral disturbance: Secondary | ICD-10-CM | POA: Diagnosis not present

## 2016-03-13 DIAGNOSIS — R278 Other lack of coordination: Secondary | ICD-10-CM | POA: Diagnosis not present

## 2016-03-14 DIAGNOSIS — R262 Difficulty in walking, not elsewhere classified: Secondary | ICD-10-CM | POA: Diagnosis not present

## 2016-03-14 DIAGNOSIS — R278 Other lack of coordination: Secondary | ICD-10-CM | POA: Diagnosis not present

## 2016-03-14 DIAGNOSIS — R296 Repeated falls: Secondary | ICD-10-CM | POA: Diagnosis not present

## 2016-03-14 DIAGNOSIS — R4182 Altered mental status, unspecified: Secondary | ICD-10-CM | POA: Diagnosis not present

## 2016-03-14 DIAGNOSIS — Z9181 History of falling: Secondary | ICD-10-CM | POA: Diagnosis not present

## 2016-03-14 DIAGNOSIS — F0281 Dementia in other diseases classified elsewhere with behavioral disturbance: Secondary | ICD-10-CM | POA: Diagnosis not present

## 2016-03-14 DIAGNOSIS — R1311 Dysphagia, oral phase: Secondary | ICD-10-CM | POA: Diagnosis not present

## 2016-03-15 DIAGNOSIS — R278 Other lack of coordination: Secondary | ICD-10-CM | POA: Diagnosis not present

## 2016-03-15 DIAGNOSIS — R296 Repeated falls: Secondary | ICD-10-CM | POA: Diagnosis not present

## 2016-03-15 DIAGNOSIS — R1311 Dysphagia, oral phase: Secondary | ICD-10-CM | POA: Diagnosis not present

## 2016-03-15 DIAGNOSIS — R4182 Altered mental status, unspecified: Secondary | ICD-10-CM | POA: Diagnosis not present

## 2016-03-15 DIAGNOSIS — Z9181 History of falling: Secondary | ICD-10-CM | POA: Diagnosis not present

## 2016-03-15 DIAGNOSIS — F0281 Dementia in other diseases classified elsewhere with behavioral disturbance: Secondary | ICD-10-CM | POA: Diagnosis not present

## 2016-03-15 DIAGNOSIS — R262 Difficulty in walking, not elsewhere classified: Secondary | ICD-10-CM | POA: Diagnosis not present

## 2016-03-23 DEATH — deceased

## 2016-11-07 IMAGING — CR DG CHEST 1V
1 series · 1 of 1 positions shown · non-contrast
Comparison: 07/08/2015

CLINICAL DATA: Shortness of breath and altered mental status for 3
days.

EXAM:
CHEST 1 VIEW

[chest ap]
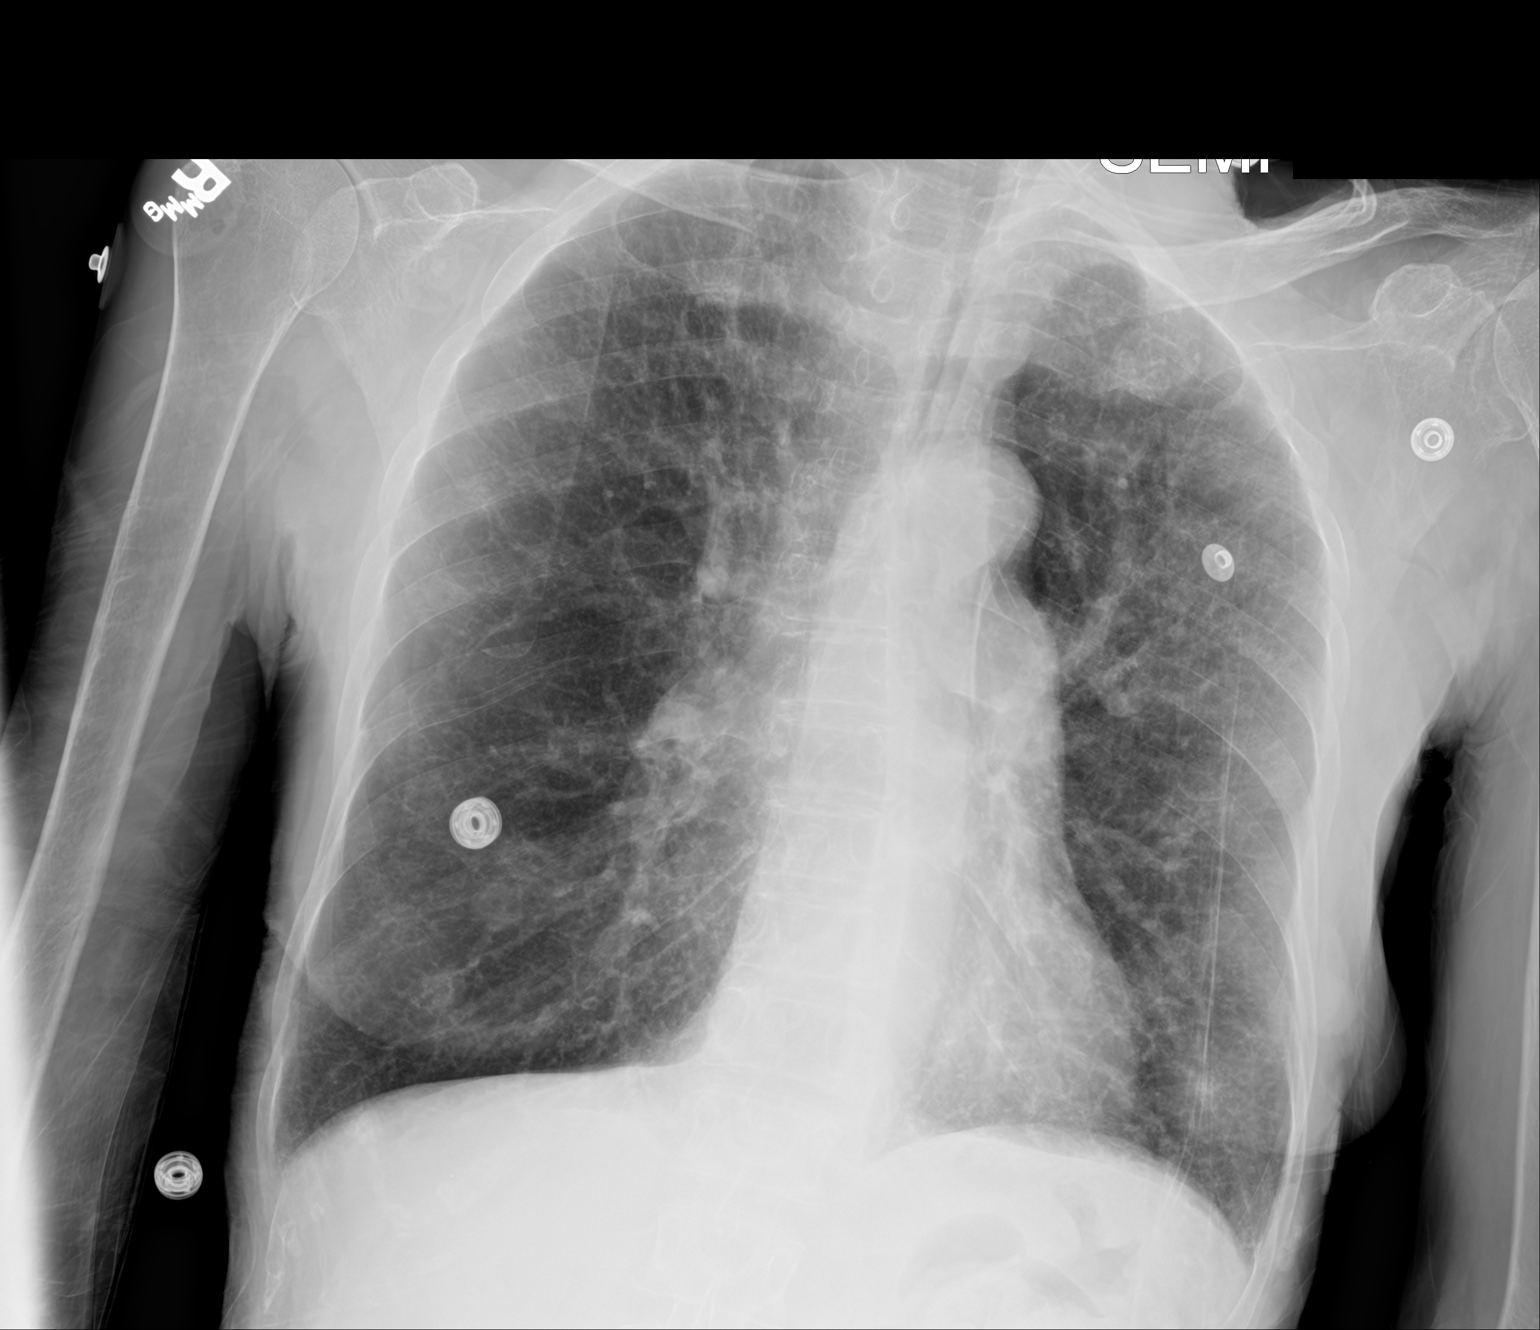

[1 of 1 positions shown; findings below may reference images not displayed]

FINDINGS: Normal heart size and pulmonary vascularity. Emphysematous changes
in the lungs with diffuse interstitial pattern likely representing
fibrosis. No focal airspace disease or consolidation. No blunting of
costophrenic angles. No pneumothorax. Mediastinal contours appear
intact.
IMPRESSION: Emphysematous changes and scattered fibrosis in the lungs. No
evidence of active pulmonary disease.
